# Patient Record
Sex: Female | Born: 1970 | Race: White | Hispanic: No | Marital: Married
Health system: Southern US, Community
[De-identification: ages and names within clinical notes are randomized; demographics above are authoritative.]

## PROBLEM LIST (undated history)

## (undated) HISTORY — PX: AUGMENTATION MAMMAPLASTY: SUR837

---

## 2010-03-06 ENCOUNTER — Ambulatory Visit (HOSPITAL_COMMUNITY): Admission: RE | Admit: 2010-03-06 | Discharge: 2010-03-06 | Payer: Self-pay | Admitting: Diagnostic Radiology

## 2010-03-08 ENCOUNTER — Encounter: Admission: RE | Admit: 2010-03-08 | Discharge: 2010-03-08 | Payer: Self-pay | Admitting: Plastic Surgery

## 2012-07-10 ENCOUNTER — Other Ambulatory Visit: Payer: Self-pay | Admitting: Obstetrics & Gynecology

## 2012-07-10 DIAGNOSIS — Z9882 Breast implant status: Secondary | ICD-10-CM

## 2012-07-10 DIAGNOSIS — Z1231 Encounter for screening mammogram for malignant neoplasm of breast: Secondary | ICD-10-CM

## 2012-08-13 ENCOUNTER — Ambulatory Visit
Admission: RE | Admit: 2012-08-13 | Discharge: 2012-08-13 | Disposition: A | Discharge status: Home / Self Care | Payer: BC Managed Care – PPO | Source: Ambulatory Visit | Attending: Obstetrics & Gynecology | Admitting: Obstetrics & Gynecology

## 2012-08-13 DIAGNOSIS — Z9882 Breast implant status: Secondary | ICD-10-CM

## 2012-08-13 DIAGNOSIS — Z1231 Encounter for screening mammogram for malignant neoplasm of breast: Secondary | ICD-10-CM

## 2013-11-25 ENCOUNTER — Other Ambulatory Visit: Payer: Self-pay

## 2013-11-25 DIAGNOSIS — Z1231 Encounter for screening mammogram for malignant neoplasm of breast: Secondary | ICD-10-CM

## 2013-12-11 ENCOUNTER — Encounter (INDEPENDENT_AMBULATORY_CARE_PROVIDER_SITE_OTHER): Payer: Self-pay

## 2013-12-11 ENCOUNTER — Ambulatory Visit
Admission: RE | Admit: 2013-12-11 | Discharge: 2013-12-11 | Disposition: A | Discharge status: Home / Self Care | Payer: BC Managed Care – PPO | Source: Ambulatory Visit

## 2013-12-11 DIAGNOSIS — Z1231 Encounter for screening mammogram for malignant neoplasm of breast: Secondary | ICD-10-CM

## 2014-11-23 ENCOUNTER — Other Ambulatory Visit: Payer: Self-pay

## 2014-11-23 DIAGNOSIS — Z1231 Encounter for screening mammogram for malignant neoplasm of breast: Secondary | ICD-10-CM

## 2014-12-23 ENCOUNTER — Ambulatory Visit
Admission: RE | Admit: 2014-12-23 | Discharge: 2014-12-23 | Disposition: A | Discharge status: Home / Self Care | Payer: BLUE CROSS/BLUE SHIELD | Source: Ambulatory Visit

## 2014-12-23 DIAGNOSIS — Z1231 Encounter for screening mammogram for malignant neoplasm of breast: Secondary | ICD-10-CM

## 2015-12-21 ENCOUNTER — Other Ambulatory Visit: Payer: Self-pay

## 2015-12-21 DIAGNOSIS — Z1231 Encounter for screening mammogram for malignant neoplasm of breast: Secondary | ICD-10-CM

## 2016-02-01 ENCOUNTER — Ambulatory Visit
Admission: RE | Admit: 2016-02-01 | Discharge: 2016-02-01 | Disposition: A | Discharge status: Home / Self Care | Payer: BLUE CROSS/BLUE SHIELD | Source: Ambulatory Visit

## 2016-02-01 DIAGNOSIS — Z1231 Encounter for screening mammogram for malignant neoplasm of breast: Secondary | ICD-10-CM

## 2016-09-25 ENCOUNTER — Ambulatory Visit (INDEPENDENT_AMBULATORY_CARE_PROVIDER_SITE_OTHER): Payer: 59 | Admitting: Rehabilitative and Restorative Service Providers"

## 2016-09-25 DIAGNOSIS — R2689 Other abnormalities of gait and mobility: Secondary | ICD-10-CM

## 2016-09-25 DIAGNOSIS — R531 Weakness: Secondary | ICD-10-CM

## 2016-09-25 DIAGNOSIS — M25561 Pain in right knee: Secondary | ICD-10-CM

## 2016-09-25 NOTE — Patient Instructions (Addendum)
Quad Sets    Slowly tighten thigh muscles of straight, left leg while counting out loud to _5-10__. Relax. Repeat __10__ times. 2-3 sets Do __2-3 __ sessions per day.   HIP: Flexion / KNEE: Extension, Straight Leg Raise    Tighten quad. Raise leg, keeping knee straight. Perform slowly. _5-10__ reps per set, _1-3__ sets per day, _2-3__ days per week    Strengthening: Hip Abduction (Side-Lying)    Tighten muscles on front of left thigh, then lift leg _10-12___ inches from surface, keeping knee locked.  Repeat __10__ times per set. Do _1-3___ sets per session. Do __2__ sessions per day.    Strengthening: Hip Adduction (Side-Lying)    Tighten muscles on front of right thigh, then lift leg __q few__ inches from surface, keeping knee locked.  Repeat ___10_ times per set. Do ___1-3_ sets per session. Do __2__ sessions per day.    Knee Extension (Sitting)   Sitting at edge of bed - use strap to slowly lower foot bending knee hold for 10-20 sec  5-18 reps 2-3 times/day    Ice ice ice and elevation above heart   TENS UNIT: This is helpful for muscle pain and spasm.   Search and Purchase a TENS 7000 2nd edition at www.tenspros.com. It should be less than $30.     TENS unit instructions: Do not shower or bathe with the unit on Turn the unit off before removing electrodes or batteries If the electrodes lose stickiness add a drop of water to the electrodes after they are disconnected from the unit and place on plastic sheet. If you continued to have difficulty, call the TENS unit company to purchase more electrodes. Do not apply lotion on the skin area prior to use. Make sure the skin is clean and dry as this will help prolong the life of the electrodes. After use, always check skin for unusual red areas, rash or other skin difficulties. If there are any skin problems, does not apply electrodes to the same area. Never remove the electrodes from the unit by pulling the  wires. Do not use the TENS unit or electrodes other than as directed. Do not change electrode placement without consultating your therapist or physician. Keep 2 fingers with between each electrode.

## 2016-09-25 NOTE — Therapy (Signed)
[facility] [address] [place], [place], [address] Phone: [phone]   Fax:  [phone]  Physical Therapy Evaluation  Patient Details  Name: Valerie Lambert MRN: [id number] Date of Birth: [birth year] Referring Provider: Dr Valma CavaAndrew Collins   Encounter Date: 09/25/2016      PT End of Session - 09/25/16 1525    Visit Number 1   Number of Visits 24   Date for PT Re-Evaluation 12/18/16   PT Start Time 1525   PT Stop Time 1619   PT Time Calculation (min) 54 min   Activity Tolerance Patient tolerated treatment well      No past medical history on file.  No past surgical history on file.  There were no vitals filed for this visit.       Subjective Assessment - 09/25/16 1534    Subjective Valerie SettleBrooke reports that she injured Rt knee in a skiing accident 08/12/16. She underwent ACL repair 09/21/16.   Pertinent History denies any medical or musculoskeletal problems    How long can you sit comfortably? no limit   How long can you stand comfortably? 5 min    How long can you walk comfortably? 5 min    Diagnostic tests MRI xrays    Patient Stated Goals get knee working again so she can return to normal activities    Currently in Pain? Yes   Pain Score 4    Pain Location Knee   Pain Orientation Right   Pain Descriptors / Indicators Sharp   Pain Type Surgical pain   Pain Onset In the past 7 days   Pain Frequency Constant   Aggravating Factors  standing; walking; getting up or down    Pain Relieving Factors meds; lying down and elevating LE             [facility] - 09/25/16 0001      Assessment   Medical Diagnosis Rt ACL repair    Referring Provider Dr Valma CavaAndrew Collins    Onset Date/Surgical Date 09/21/16   Hand Dominance Right   Next MD Visit 10/30/16   Prior Therapy none     Precautions   Precaution Comments ACL precautions    Required Braces or Orthoses Knee Immobilizer - Right  24/7 off for bathing     Knee Immobilizer - Right On at all times;Other (comment)  locked in full extension - off for exercise      Restrictions   Weight Bearing Restrictions No     Balance Screen   Has the patient fallen in the past 6 months Yes   How many times? 1   Has the patient had a decrease in activity level because of a fear of falling?  No   Is the patient reluctant to leave their home because of a fear of falling?  No     Home Environment   Additional Comments multilevel home - stays on first level - 2 steps to enter railing Rt ascending      Prior Function   Level of Independence Independent   Vocation Full time employment   HerbalistVocation Requirements sales; travel and computer work    Leisure household chores; care for kids      Observation/Other Assessments   Focus on Therapeutic Outcomes (FOTO)  70% limitation      Sensation   Additional Comments WFL's per pt report      Posture/Postural Control   Posture Comments head forward; shoudlers rounded; flexed forward  at hips; wt shifted to Lt LE in standing      AROM   Overall AROM Comments Lt LE WFL's    Right Knee Extension 0   Right Knee Flexion 45  sitting at edge of bed - Rt LE supported      Strength   Overall Strength Comments Lt LE WFL's Rt not tested      Flexibility   Hamstrings Rt tightness ~ 70 deg; Lt ~90 deg      Ambulation/Gait   Ambulation/Gait Yes   Ambulation/Gait Assistance 7: Independent;5: Supervision   Ambulation Distance (Feet) 70 Feet  x2   Assistive device Rolling walker   Gait Pattern Decreased weight shift to right;Right hip hike;Left steppage;Trunk flexed;Wide base of support   Ambulation Surface Level;Indoor   Gait velocity slowed    Gait Comments address gait pattern instructing pt in improved step length and more upright posture                    [facility] Adult PT Treatment/Exercise - 09/25/16 0001      Knee/Hip Exercises: Stretches   Passive Hamstring Stretch 3 reps;30 seconds  supine  with strap      Knee/Hip Exercises: Seated   Knee/Hip Flexion assisted knee flexion 5-10 sec hold      Knee/Hip Exercises: Supine   Quad Sets Strengthening;Right;1 set;10 reps  5 sec hold    Straight Leg Raises Strengthening;Right;1 set;5 reps  PT assist      Knee/Hip Exercises: Sidelying   Hip ABduction Strengthening;Right;1 set;10 reps   Hip ADduction Strengthening;Right;1 set;10 reps     Vasopneumatic   Number Minutes Vasopneumatic  15 minutes   Vasopnuematic Location  Knee  Rt   Vasopneumatic Pressure Low   Vasopneumatic Temperature  34 deg                 PT Education - 09/25/16 1605    Education provided Yes   Education Details HEP TENS    Person(s) Educated Patient   Methods Explanation;Demonstration;Tactile cues;Verbal cues;Handout   Comprehension Verbalized understanding;Returned demonstration;Verbal cues required;Tactile cues required          PT Short Term Goals - 09/25/16 1718      PT SHORT TERM GOAL #1   Title Increase AROM Rt knee 0-115 deg 11/06/16   Time 6   Period Weeks   Status New     PT SHORT TERM GOAL #2   Title Independent gait with least assistive device and good gait pattern 11/06/16   Time 6   Period Weeks   Status New     PT SHORT TERM GOAL #3   Title Independent i ninitial HEP 11/06/16   Time 6   Period Weeks           PT Long Term Goals - 09/25/16 1719      PT LONG TERM GOAL #1   Title Full ROM Rt knee 12/18/16    Time 12   Period Weeks   Status New     PT LONG TERM GOAL #2   Title 5/5 strength Rt LE 12/18/16   Time 12   Period Weeks   Status New     PT LONG TERM GOAL #3   Title Normal gait pattern without assistive device 12/18/16   Time 12   Period Weeks   Status New     PT LONG TERM GOAL #4   Title Independent in HEP 12/18/16   Time 12  Period Weeks   Status New     PT LONG TERM GOAL #5   Title Improve FOTO to </= 41% limitation 12/18/16   Time 12   Period Weeks   Status New                Plan - 09/25/16 1714    Clinical Impression Statement Valerie Lambert presents s/p Rt ACL repair 09/21/16. She has abnormal gait pattern; limited ROM(per post op protocol - limited 0 to 90 deg); decreased strength; decreased functional activity level; pain on a daily basis. Patient will benefit from PT to address problems identified.    Rehab Potential Good   PT Frequency 2x / week   PT Duration 12 weeks   PT Treatment/Interventions Patient/family education;ADLs/Self Care Home Management;Cryotherapy;Electrical Stimulation;Iontophoresis 4mg /ml Dexamethasone;Moist Heat;Ultrasound;Dry needling;Manual techniques;Vasopneumatic Device;Therapeutic activities;Therapeutic exercise   PT Next Visit Plan progress with ROM and strengthening per protocol; modalities as indicated; tiral of estim for pain management    Consulted and Agree with Plan of Care Patient      Patient will benefit from skilled therapeutic intervention in order to improve the following deficits and impairments:  Postural dysfunction, Improper body mechanics, Pain, Decreased range of motion, Decreased mobility, Decreased strength, Abnormal gait, Increased fascial restricitons, Increased muscle spasms, Decreased activity tolerance  Visit Diagnosis: Acute pain of right knee - Plan: PT plan of care cert/re-cert  Weakness generalized - Plan: PT plan of care cert/re-cert  Other abnormalities of gait and mobility - Plan: PT plan of care cert/re-cert     Problem List There are no active problems to display for this patient.   Aryon Nham Rober Minion PT, MPH  09/25/2016, 5:23 PM  [facility] [address] [place], [place], [address] Phone: [phone]   Fax:  [phone]  Name: Jia Dottavio MRN: [id number] Date of Birth: 07/04/1971

## 2016-09-28 ENCOUNTER — Ambulatory Visit (INDEPENDENT_AMBULATORY_CARE_PROVIDER_SITE_OTHER): Payer: 59 | Admitting: Physical Therapy

## 2016-09-28 DIAGNOSIS — R2689 Other abnormalities of gait and mobility: Secondary | ICD-10-CM

## 2016-09-28 DIAGNOSIS — M25561 Pain in right knee: Secondary | ICD-10-CM | POA: Diagnosis not present

## 2016-09-28 DIAGNOSIS — R531 Weakness: Secondary | ICD-10-CM | POA: Diagnosis not present

## 2016-09-28 NOTE — Therapy (Signed)
[facility] [place] [address] [place], [place], [address] Phone: [phone]   Fax:  [phone]  Physical Therapy Treatment  Patient Details  Name: Valerie Lambert MRN: [id number] Date of Birth: January 29, 1971 Referring Provider: Dr. Valma Cava   Encounter Date: 09/28/2016      PT End of Session - 09/28/16 1127    Visit Number 2   Number of Visits 24   Date for PT Re-Evaluation 12/18/16   PT Start Time 1015   PT Stop Time 1119   PT Time Calculation (min) 64 min   Activity Tolerance Patient tolerated treatment well   Behavior During Therapy [facility] for tasks assessed/performed      No past medical history on file.  No past surgical history on file.  There were no vitals filed for this visit.      Subjective Assessment - 09/28/16 1029    Subjective Pt reports she was confused about HEP, would like some clarification on how/when to perform. Has been icing and elevating.    Currently in Pain? No/denies   Pain Score 0-No pain  Pulling sensation with knee flexion   Pain Location Knee   Pain Orientation Right            [facility] PT Assessment - 09/28/16 0001      Assessment   Medical Diagnosis Rt ACL repair    Referring Provider Dr. Valma Cava    Onset Date/Surgical Date 09/21/16   Hand Dominance Right   Next MD Visit 10/30/16   Prior Therapy none     Precautions   Precaution Comments ACL precautions    Required Braces or Orthoses Knee Immobilizer - Right  24/7 off for bathing    Knee Immobilizer - Right On at all times;Other (comment)  locked in full extension - off for exercise      AROM   Right Knee Extension -4   Right Knee Flexion 80          [facility] Adult PT Treatment/Exercise - 09/28/16 0001      Knee/Hip Exercises: Standing   Other Standing Knee Exercises weight shifts side to side and front to back with UE support on walker.       Knee/Hip Exercises: Seated   Knee/Hip Flexion assisted  (use of other LE) knee flexion 5-10 sec hold, 5 reps     Other Seated Knee/Hip Exercises seated scoots x 10 sec, x 5 reps      Knee/Hip Exercises: Supine   Quad Sets Right;1 set;10 reps  5 sec hold   Quad Sets Limitations early fatigue; difficulty initiating   Heel Slides Right;1 set;10 reps  to 60 deg.    Straight Leg Raises Strengthening;Right;2 sets;5 reps  quad lag noted   Patellar Mobs pt educated on how to perform per protocol.    Other Supine Knee/Hip Exercises hamstring isometrics 10 sec x 5 reps @ 30 deg, 60 deg.    Other Supine Knee/Hip Exercises Educated pt on protocol and precautions. Issued copy of rehab protocol from surgeon.      Knee/Hip Exercises: Sidelying   Hip ABduction Strengthening;Right;1 set;10 reps   Hip ABduction Limitations (challenging)   Hip ADduction Strengthening;Right;1 set;5 reps     Modalities   Modalities Electrical Stimulation;Vasopneumatic     Electrical Stimulation   Electrical Stimulation Location Rt knee   Electrical Stimulation Action ion repelling    Electrical Stimulation Parameters to tolerance   Electrical Stimulation Goals Edema;Pain     Vasopneumatic  Number Minutes Vasopneumatic  15 minutes   Vasopnuematic Location  Knee  Rt   Vasopneumatic Pressure Low   Vasopneumatic Temperature  34 deg                 PT Education - 09/28/16 1103    Education provided Yes   Education Details HEP, added sitting scoots.    Person(s) Educated Patient   Methods Explanation;Handout;Demonstration   Comprehension Verbalized understanding;Returned demonstration          PT Short Term Goals - 09/28/16 1131      PT SHORT TERM GOAL #1   Title Increase AROM Rt knee 0-115 deg 11/06/16   Time 6   Period Weeks   Status On-going     PT SHORT TERM GOAL #2   Title Independent gait with least assistive device and good gait pattern 11/06/16   Time 6   Period Weeks   Status On-going     PT SHORT TERM GOAL #3   Title Independent in  initial HEP 11/06/16   Time 6   Period Weeks   Status On-going           PT Long Term Goals - 09/28/16 1129      PT LONG TERM GOAL #1   Title Full ROM Rt knee 12/18/16    Time 12   Period Weeks   Status On-going     PT LONG TERM GOAL #2   Title 5/5 strength Rt LE 12/18/16   Time 12   Period Weeks   Status On-going     PT LONG TERM GOAL #3   Title Normal gait pattern without assistive device 12/18/16   Time 12   Period Weeks   Status On-going     PT LONG TERM GOAL #4   Title Independent in HEP 12/18/16   Time 12   Period Weeks   Status On-going     PT LONG TERM GOAL #5   Title Improve FOTO to </= 41% limitation 12/18/16   Time 12   Period Weeks   Status On-going               Plan - 09/28/16 1127    Clinical Impression Statement Pt demonstrated improved Rt knee flexion during the course of treatment today (64 deg up to 80 deg). Pt tolerated all new exercises well, with minimal to no pain.  Pt issued copy of her protocol as well as new exercises within protocol.    Rehab Potential Good   PT Frequency 2x / week   PT Duration 12 weeks   PT Treatment/Interventions Patient/family education;ADLs/Self Care Home Management;Cryotherapy;Electrical Stimulation;Iontophoresis 4mg /ml Dexamethasone;Moist Heat;Ultrasound;Dry needling;Manual techniques;Vasopneumatic Device;Therapeutic activities;Therapeutic exercise   PT Next Visit Plan progress with ROM and strengthening per protocol; modalities as indicated   Consulted and Agree with Plan of Care Patient      Patient will benefit from skilled therapeutic intervention in order to improve the following deficits and impairments:  Postural dysfunction, Improper body mechanics, Pain, Decreased range of motion, Decreased mobility, Decreased strength, Abnormal gait, Increased fascial restricitons, Increased muscle spasms, Decreased activity tolerance  Visit Diagnosis: Acute pain of right knee  Weakness generalized  Other  abnormalities of gait and mobility     Problem List There are no active problems to display for this patient.  Mayer Camel, PTA 09/28/16 11:36 AM  [facility] [address] [place], [place], [address] Phone: [phone]   Fax:  [phone]  Name:  Valerie Lambert MRN: [id number] Date of Birth: 1971-01-26

## 2016-09-28 NOTE — Patient Instructions (Addendum)
Sitting Scoot    Scoot to the front of chair, then scoot back. Hold 10 seconds  Repeat __10__ times. Do _3___ sessions per day.  * can also use opposite leg to pull Right lower leg back.   Flexion: Isometric (Supine)    Position Patient: Bend left knee to about 30, 60 degrees, 90 deg. Dig heel in, hold 10 sec x 5-10 reps.   [facility] [address] [place], [place] [address]  [phone] (office) [phone] (fax)

## 2016-10-02 ENCOUNTER — Encounter: Payer: Self-pay | Admitting: Physical Therapy

## 2016-10-03 ENCOUNTER — Encounter: Payer: 59 | Admitting: Rehabilitative and Restorative Service Providers"

## 2016-10-05 ENCOUNTER — Ambulatory Visit (INDEPENDENT_AMBULATORY_CARE_PROVIDER_SITE_OTHER): Payer: 59 | Admitting: Physical Therapy

## 2016-10-05 DIAGNOSIS — R2689 Other abnormalities of gait and mobility: Secondary | ICD-10-CM | POA: Diagnosis not present

## 2016-10-05 DIAGNOSIS — M25561 Pain in right knee: Secondary | ICD-10-CM | POA: Diagnosis not present

## 2016-10-05 DIAGNOSIS — R531 Weakness: Secondary | ICD-10-CM

## 2016-10-05 NOTE — Therapy (Signed)
[facility] [place] [address] [place], [place], [address] Phone: [phone]   Fax:  [phone]  Physical Therapy Treatment  Patient Details  Name: Valerie Lambert MRN: [id number] Date of Birth: Mar 18, 1971 Referring Provider: Dr. Valma Cava  Encounter Date: 10/05/2016      PT End of Session - 10/05/16 1116    Visit Number 3   Number of Visits 24   Date for PT Re-Evaluation 12/18/16   PT Start Time 1105   PT Stop Time 1202   PT Time Calculation (min) 57 min      No past medical history on file.  No past surgical history on file.  There were no vitals filed for this visit.      Subjective Assessment - 10/05/16 1234    Subjective Pt ambulates into therapy with SPC in Rt hand; reports she has been using it for a day.  She reports her Rt calf has been very sore lately so she has been massaging it.  She has been working hard on SLR and elevating legs.  She has questions regarding when she'll be able to drive and how to adjust her brace (keeps slipping down).  She has taken the bandages off Rt knee, steri strips are in place over incisions.  She is still wearing stocking and brace 24/7.     Currently in Pain? No/denies   Pain Score 0-No pain            [facility] PT Assessment - 10/05/16 0001      Assessment   Medical Diagnosis Rt ACL repair    Referring Provider Dr. Valma Cava   Onset Date/Surgical Date 09/21/16   Hand Dominance Right   Next MD Visit 10/30/16   Prior Therapy none     Precautions   Precaution Comments ACL precautions    Required Braces or Orthoses Knee Immobilizer - Right  24/7 off for bathing    Knee Immobilizer - Right On at all times;Other (comment)  locked in full extension - off for exercise      AROM   Right Knee Extension -8   Right Knee Flexion 90     Flexibility   Soft Tissue Assessment /Muscle Length yes   Quadriceps Rt 65 deg            [facility] Adult PT  Treatment/Exercise - 10/05/16 0001      Knee/Hip Exercises: Stretches   Lobbyist Right;3 reps;30 seconds  supine with strap     Knee/Hip Exercises: Aerobic   Recumbent Bike semi-circles for ROM x 5 min     Knee/Hip Exercises: Standing   Forward Step Up Right;1 set;10 reps;Hand Hold: 2  3" step   Stairs Pt educated on step to pattern (up with LLE, down with RLE) with SPC when brace on at home.  Pt returned demo on 2 -6" steps x 2 reps    Gait Training Pt educated on proper use of SPC with gait (placement, distance), step to pattern with VC for heel strike and to decrease Rt step length.  80 ft total; improved pattern with increased distance.     Other Standing Knee Exercises Rt TKE with tactile cues x 3 sec x 5 reps.    Other Standing Knee Exercises Rt hamstring curls, to tolerance x 8 reps.      Knee/Hip Exercises: Seated   Other Seated Knee/Hip Exercises seated scoots x 10 sec, x 10 reps      Knee/Hip Exercises:  Supine   Quad Sets Right;1 set;10 reps  5 sec hold   Quad Sets Limitations early fatigue, difficulty intiating.    Straight Leg Raises Right;2 sets;10 reps   Straight Leg Raises Limitations quad lag noted.    Other Supine Knee/Hip Exercises long sitting quad set with small leg lift x 5 reps      Knee/Hip Exercises: Sidelying   Hip ABduction Strengthening;Right;1 set;10 reps   Hip ABduction Limitations tactile cues to improve body position.      Modalities   Modalities Science writer Parameters to tolerance    Electrical Stimulation Goals Edema;Pain     Vasopneumatic   Number Minutes Vasopneumatic  15 minutes   Vasopnuematic Location  Knee  Rt   Vasopneumatic Pressure Low   Vasopneumatic Temperature  34 deg                 PT Education - 10/05/16 1236    Education provided Yes    Education Details stair training, gait with SPC (step to pattern).  reviewed protocol for 2wks ACL post op.    Person(s) Educated Patient   Methods Explanation   Comprehension Verbalized understanding          PT Short Term Goals - 10/05/16 1252      PT SHORT TERM GOAL #1   Title Increase AROM Rt knee 0-115 deg 11/06/16   Time 6   Period Weeks   Status On-going     PT SHORT TERM GOAL #2   Title Independent gait with least assistive device and good gait pattern 11/06/16   Time 6   Period Weeks   Status On-going     PT SHORT TERM GOAL #3   Title Independent in initial HEP 11/06/16   Time 6   Period Weeks   Status On-going           PT Long Term Goals - 10/05/16 1251      PT LONG TERM GOAL #1   Title Full ROM Rt knee 12/18/16    Time 12   Period Weeks   Status On-going     PT LONG TERM GOAL #2   Title 5/5 strength Rt LE 12/18/16   Time 12   Period Weeks   Status On-going     PT LONG TERM GOAL #3   Title Normal gait pattern without assistive device 12/18/16   Time 12   Period Weeks   Status On-going     PT LONG TERM GOAL #4   Title Independent in HEP 12/18/16   Time 12   Period Weeks   Status On-going     PT LONG TERM GOAL #5   Title Improve FOTO to </= 41% limitation 12/18/16   Time 12   Period Weeks   Status On-going               Plan - 10/05/16 1252    Clinical Impression Statement [name] is in the last day of week 2 post-op ACL reconstruction.  She switched to [facility] yesterday and is observed using cane in Rt hand and walking with Rt foot plantar flexed.  With demo and VC, short gait trials improved quality; improved Rt heel strike in step0-to pattern.  Her Rt knee ext ROM has decreased, flexion has improved by 10 deg.  She continues to demonstrate poor Rt quad  contraction and extensor lag with SLR. She is making gradual gains towards established goals.    Rehab Potential Good   PT Frequency 2x / week   PT Duration 12 weeks   PT  Treatment/Interventions Patient/family education;ADLs/Self Care Home Management;Cryotherapy;Electrical Stimulation;Iontophoresis 4mg /ml Dexamethasone;Moist Heat;Ultrasound;Dry needling;Manual techniques;Vasopneumatic Device;Therapeutic activities;Therapeutic exercise   PT Next Visit Plan progress with ROM and strengthening per protocol; modalities as indicated   Consulted and Agree with Plan of Care Patient      Patient will benefit from skilled therapeutic intervention in order to improve the following deficits and impairments:  Postural dysfunction, Improper body mechanics, Pain, Decreased range of motion, Decreased mobility, Decreased strength, Abnormal gait, Increased fascial restricitons, Increased muscle spasms, Decreased activity tolerance  Visit Diagnosis: Acute pain of right knee  Weakness generalized  Other abnormalities of gait and mobility     Problem List There are no active problems to display for this patient.  Mayer CamelJennifer Carlson-Long, PTA 10/05/16 1:08 PM  [facility] [address] [place], [place], [address] Phone: [phone]   Fax:  [phone]  Name: Carlyon Prowsshley [name] Wiedel MRN: [id number] Date of Birth: 12-30-1970

## 2016-10-09 ENCOUNTER — Ambulatory Visit (INDEPENDENT_AMBULATORY_CARE_PROVIDER_SITE_OTHER): Payer: 59 | Admitting: Physical Therapy

## 2016-10-09 DIAGNOSIS — R2689 Other abnormalities of gait and mobility: Secondary | ICD-10-CM

## 2016-10-09 DIAGNOSIS — M25561 Pain in right knee: Secondary | ICD-10-CM

## 2016-10-09 DIAGNOSIS — R531 Weakness: Secondary | ICD-10-CM | POA: Diagnosis not present

## 2016-10-09 NOTE — Therapy (Signed)
[facility] [place] [address] [place], [place], [address] Phone: [phone]   Fax:  [phone]  Physical Therapy Treatment  Patient Details  Name: Valerie Lambert MRN: [id number] Date of Birth: 1971-01-12 Referring Provider: Dr. Valma Cava  Encounter Date: 10/09/2016      PT End of Session - 10/09/16 1104    Visit Number 4   Number of Visits 24   Date for PT Re-Evaluation 12/18/16   PT Start Time 1015   PT Stop Time 1118   PT Time Calculation (min) 63 min   Activity Tolerance Patient tolerated treatment well   Behavior During Therapy [facility] for tasks assessed/performed      No past medical history on file.  No past surgical history on file.  There were no vitals filed for this visit.      Subjective Assessment - 10/09/16 1019    Subjective feels like she's having a hard time with a good quad contraction.  calf is feeling much better now.   Pertinent History denies any medical or musculoskeletal problems    Patient Stated Goals get knee working again so she can return to normal activities    Currently in Pain? No/denies   Pain Score --  expected discomfort with bending                         [facility] Adult PT Treatment/Exercise - 10/09/16 1020      Knee/Hip Exercises: Aerobic   Recumbent Bike partial revolutions x 8 min     Knee/Hip Exercises: Supine   Quad Sets Right;2 sets;10 reps   Quad Sets Limitations 1st set with e-stim; 2nd set with small towel roll under distal thigh with improved quad activation   Short Arc [employer] Right;10 reps   Short Arc Quad Sets Limitations small towel with cues for quad control   Heel Slides Right;AAROM;10 reps   Heel Slides Limitations with strap   Straight Leg Raises Right;10 reps   Straight Leg Raises Limitations with estim and cues to decrease quad lag     Modalities   Modalities Corporate treasurer Stimulation Location Rt quad / R knee   Electrical Stimulation Action Symmetric Biphasic 10 sec on/20 sec off for improved quad activation up to 48 mA; ion repelling   Electrical Stimulation Goals Neuromuscular facilitation;Strength;Edema;Pain     Vasopneumatic   Number Minutes Vasopneumatic  15 minutes   Vasopnuematic Location  Knee  Rt   Vasopneumatic Pressure Low   Vasopneumatic Temperature  34 deg                 PT Education - 10/09/16 1104    Education provided Yes   Education Details add towel to quad set    Person(s) Educated Patient   Methods Explanation;Demonstration   Comprehension Verbalized understanding;Returned demonstration          PT Short Term Goals - 10/05/16 1252      PT SHORT TERM GOAL #1   Title Increase AROM Rt knee 0-115 deg 11/06/16   Time 6   Period Weeks   Status On-going     PT SHORT TERM GOAL #2   Title Independent gait with least assistive device and good gait pattern 11/06/16   Time 6   Period Weeks   Status On-going     PT SHORT TERM GOAL #3   Title Independent in initial HEP  11/06/16   Time 6   Period Weeks   Status On-going           PT Long Term Goals - 10/05/16 1251      PT LONG TERM GOAL #1   Title Full ROM Rt knee 12/18/16    Time 12   Period Weeks   Status On-going     PT LONG TERM GOAL #2   Title 5/5 strength Rt LE 12/18/16   Time 12   Period Weeks   Status On-going     PT LONG TERM GOAL #3   Title Normal gait pattern without assistive device 12/18/16   Time 12   Period Weeks   Status On-going     PT LONG TERM GOAL #4   Title Independent in HEP 12/18/16   Time 12   Period Weeks   Status On-going     PT LONG TERM GOAL #5   Title Improve FOTO to </= 41% limitation 12/18/16   Time 12   Period Weeks   Status On-going               Plan - 10/09/16 1253    Clinical Impression Statement Session today focused on increasing knee flexion and improving quad control of RLE.  Utilized  estim for quad activation and pt able to perform improved quad set after estim.  Progressing well towards goals.   PT Treatment/Interventions Patient/family education;ADLs/Self Care Home Management;Cryotherapy;Electrical Stimulation;Iontophoresis 4mg /ml Dexamethasone;Moist Heat;Ultrasound;Dry needling;Manual techniques;Vasopneumatic Device;Therapeutic activities;Therapeutic exercise   PT Next Visit Plan progress with ROM and strengthening per protocol; modalities as indicated   Consulted and Agree with Plan of Care Patient      Patient will benefit from skilled therapeutic intervention in order to improve the following deficits and impairments:  Postural dysfunction, Improper body mechanics, Pain, Decreased range of motion, Decreased mobility, Decreased strength, Abnormal gait, Increased fascial restricitons, Increased muscle spasms, Decreased activity tolerance  Visit Diagnosis: Acute pain of right knee  Weakness generalized  Other abnormalities of gait and mobility     Problem List There are no active problems to display for this patient.     Clarita CraneStephanie F Osman Calzadilla, PT, DPT 10/09/16 12:59 PM    [facility] [address] [place], [place], [address] Phone: [phone]   Fax:  [phone]  Name: Valerie Lambert MRN: [id number] Date of Birth: [birth year]

## 2016-10-12 ENCOUNTER — Ambulatory Visit (INDEPENDENT_AMBULATORY_CARE_PROVIDER_SITE_OTHER): Payer: 59 | Admitting: Physical Therapy

## 2016-10-12 DIAGNOSIS — M25561 Pain in right knee: Secondary | ICD-10-CM | POA: Diagnosis not present

## 2016-10-12 DIAGNOSIS — R2689 Other abnormalities of gait and mobility: Secondary | ICD-10-CM

## 2016-10-12 DIAGNOSIS — R531 Weakness: Secondary | ICD-10-CM | POA: Diagnosis not present

## 2016-10-12 NOTE — Therapy (Addendum)
[facility] [place] [address] [place], [place], [address] Phone: [phone]   Fax:  [phone]  Physical Therapy Treatment  Patient Details  Name: Valerie Lambert MRN: [id number] Date of Birth: 07/26/1970 Referring Provider: Dr. Valma Cava  Encounter Date: 10/12/2016      PT End of Session - 10/12/16 1032    Visit Number 5   Number of Visits 24   Date for PT Re-Evaluation 12/18/16   PT Start Time 1017   PT Stop Time 1115   PT Time Calculation (min) 58 min      No past medical history on file.  No past surgical history on file.  There were no vitals filed for this visit.      Subjective Assessment - 10/12/16 1312    Subjective Pt reports she has been working hard at quad contraction and leg lifts. She is still avoiding stairs.  Steri strips came off in shower today.    Currently in Pain? No/denies   Pain Score 0-No pain            [facility] PT Assessment - 10/12/16 0001      Assessment   Medical Diagnosis Rt ACL repair    Referring Provider Dr. Valma Cava   Onset Date/Surgical Date 09/21/16   Hand Dominance Right   Next MD Visit 10/30/16     Precautions   Precaution Comments ACL precautions    Required Braces or Orthoses Knee Immobilizer - Right  24/7 off for bathing    Knee Immobilizer - Right On at all times;Other (comment)  locked in full extension - off for exercise      AROM   Right Knee Extension -7  -5 after stretches   Right Knee Flexion 95     Flexibility   Quadriceps Rt knee 75 deg                     [facility] Adult PT Treatment/Exercise - 10/12/16 0001      Knee/Hip Exercises: Stretches   Passive Hamstring Stretch 3 reps;30 seconds  supine with strap    Quad Stretch Right;3 reps;30 seconds  supine with strap     Knee/Hip Exercises: Aerobic   Recumbent Bike partial revolutions x 6.5 min     Knee/Hip Exercises: Seated   Other Seated Knee/Hip Exercises seated  scoots x 10 sec, x 10 reps      Knee/Hip Exercises: Supine   Quad Sets Right;1 set;5 reps   Quad Sets Limitations continues to substitute glute/hamstring   Heel Slides Right;AAROM;5 reps   Heel Slides Limitations with strap   Straight Leg Raises Right;10 reps     Knee/Hip Exercises: Prone   Hamstring Curl 1 set;10 reps  between prone hang trials.    Prone Knee Hang 1 minute  RLE, 2 trials.      Modalities   Modalities Counsellor Parameters to tolerance    Electrical Stimulation Goals Edema     Vasopneumatic   Number Minutes Vasopneumatic  15 minutes   Vasopnuematic Location  Knee  Rt   Vasopneumatic Pressure Low   Vasopneumatic Temperature  34 deg      Manual Therapy   Manual Therapy Passive ROM;Taping   Passive ROM Overpressure into Rt knee ext with heel propped, multiple reps.    Kinesiotex Edema  Kinesiotix   Edema web pattern of Rock tape applied to ant Rt knee (avoiding incisions) to reduce edema.                 PT Education - 10/12/16 1107    Education provided Yes   Education Details HEP    Person(s) Educated Patient   Methods Explanation   Comprehension Verbalized understanding          PT Short Term Goals - 10/05/16 1252      PT SHORT TERM GOAL #1   Title Increase AROM Rt knee 0-115 deg 11/06/16   Time 6   Period Weeks   Status On-going     PT SHORT TERM GOAL #2   Title Independent gait with least assistive device and good gait pattern 11/06/16   Time 6   Period Weeks   Status On-going     PT SHORT TERM GOAL #3   Title Independent in initial HEP 11/06/16   Time 6   Period Weeks   Status On-going           PT Long Term Goals - 10/05/16 1251      PT LONG TERM GOAL #1   Title Full ROM Rt knee 12/18/16    Time 12   Period Weeks   Status On-going     PT LONG  TERM GOAL #2   Title 5/5 strength Rt LE 12/18/16   Time 12   Period Weeks   Status On-going     PT LONG TERM GOAL #3   Title Normal gait pattern without assistive device 12/18/16   Time 12   Period Weeks   Status On-going     PT LONG TERM GOAL #4   Title Independent in HEP 12/18/16   Time 12   Period Weeks   Status On-going     PT LONG TERM GOAL #5   Title Improve FOTO to </= 41% limitation 12/18/16   Time 12   Period Weeks   Status On-going               Plan - 10/12/16 1113    Clinical Impression Statement Pt demonstrated improvement in Rt knee flexion ROM; continues to lack full knee ext ROM.  Pt continues with weak quad contraction in RLE.  May benefit from additional sessions with estim for muscle re-ed. Pt making gradual gains towards established goals.    Rehab Potential Good   PT Frequency 2x / week   PT Duration 12 weeks   PT Treatment/Interventions Patient/family education;ADLs/Self Care Home Management;Cryotherapy;Electrical Stimulation;Iontophoresis 4mg /ml Dexamethasone;Moist Heat;Ultrasound;Dry needling;Manual techniques;Vasopneumatic Device;Therapeutic activities;Therapeutic exercise   PT Next Visit Plan estim for muscle re-ed, begin stairs and standing proprioception.    Consulted and Agree with Plan of Care Patient      Patient will benefit from skilled therapeutic intervention in order to improve the following deficits and impairments:  Postural dysfunction, Improper body mechanics, Pain, Decreased range of motion, Decreased mobility, Decreased strength, Abnormal gait, Increased fascial restricitons, Increased muscle spasms, Decreased activity tolerance  Visit Diagnosis: Acute pain of right knee  Weakness generalized  Other abnormalities of gait and mobility     Problem List There are no active problems to display for this patient.  Mayer CamelJennifer Carlson-Long, PTA 10/12/16 1:13 PM  [facility] [place] [address] [place], [place], [address] Phone: [phone]   Fax:  [phone]  Name: Valerie Lambert MRN: [id number] Date of Birth: 05-02-1971

## 2016-10-12 NOTE — Patient Instructions (Signed)
*   hamstring stretch  *gentle overpressure into extension (straight knee)  * continue all other exercises issued.   Knee Extension Mobilization: Hang (Prone)    With table supporting thighs, place __NO-0__ pound weight on right ankle. Hold __1-2__ minutes. Repeat __2__ times

## 2016-10-16 ENCOUNTER — Ambulatory Visit (INDEPENDENT_AMBULATORY_CARE_PROVIDER_SITE_OTHER): Payer: 59 | Admitting: Physical Therapy

## 2016-10-16 DIAGNOSIS — R2689 Other abnormalities of gait and mobility: Secondary | ICD-10-CM | POA: Diagnosis not present

## 2016-10-16 DIAGNOSIS — M25561 Pain in right knee: Secondary | ICD-10-CM | POA: Diagnosis not present

## 2016-10-16 DIAGNOSIS — R531 Weakness: Secondary | ICD-10-CM

## 2016-10-16 NOTE — Therapy (Signed)
[facility] [place] [address] [place], [place], [address] Phone: [phone]   Fax:  [phone]  Physical Therapy Treatment  Patient Details  Name: Valerie Lambert MRN: [id number] Date of Birth: 1971-05-06 Referring Provider: Dr. Valma Cava  Encounter Date: 10/16/2016      PT End of Session - 10/16/16 1117    Visit Number 6   Number of Visits 24   Date for PT Re-Evaluation 12/18/16   PT Start Time 0930   PT Stop Time 1030   PT Time Calculation (min) 60 min   Activity Tolerance Patient tolerated treatment well   Behavior During Therapy [facility] for tasks assessed/performed      No past medical history on file.  No past surgical history on file.  There were no vitals filed for this visit.      Subjective Assessment - 10/16/16 0932    Subjective still having a hard time sleeping, but otherwise doing well.  pain is minimal   Pertinent History denies any medical or musculoskeletal problems    Patient Stated Goals get knee working again so she can return to normal activities    Currently in Pain? No/denies                         [facility] Adult PT Treatment/Exercise - 10/16/16 0934      Knee/Hip Exercises: Aerobic   Recumbent Bike partial revolutions x 8 min     Knee/Hip Exercises: Supine   Quad Sets Right;2 sets;10 reps   Quad Sets Limitations after SAQs- mod to max cues for quad activation   Short Arc [employer] Right;2 sets;10 reps   Short Arc Quad Sets Limitations black bolster, then decreasing to pillow-for increased quad control   Straight Leg Raises Limitations x 5 min with Estim     Modalities   Modalities Proofreader Location Rt quad / R knee   Electrical Stimulation Action symmetric biphasic x 10 min (5sec on/5 sec off) with quad sets x 5 min; and SLR x 5 min; then IFC   Electrical Stimulation Parameters up  to 65 mA intensity with SBP, to tolerance with IFC   Electrical Stimulation Goals Edema;Strength;Neuromuscular facilitation;Pain     Vasopneumatic   Number Minutes Vasopneumatic  15 minutes   Vasopnuematic Location  Knee  Rt   Vasopneumatic Pressure Low   Vasopneumatic Temperature  34 deg                   PT Short Term Goals - 10/05/16 1252      PT SHORT TERM GOAL #1   Title Increase AROM Rt knee 0-115 deg 11/06/16   Time 6   Period Weeks   Status On-going     PT SHORT TERM GOAL #2   Title Independent gait with least assistive device and good gait pattern 11/06/16   Time 6   Period Weeks   Status On-going     PT SHORT TERM GOAL #3   Title Independent in initial HEP 11/06/16   Time 6   Period Weeks   Status On-going           PT Long Term Goals - 10/05/16 1251      PT LONG TERM GOAL #1   Title Full ROM Rt knee 12/18/16    Time 12   Period Weeks   Status On-going  PT LONG TERM GOAL #2   Title 5/5 strength Rt LE 12/18/16   Time 12   Period Weeks   Status On-going     PT LONG TERM GOAL #3   Title Normal gait pattern without assistive device 12/18/16   Time 12   Period Weeks   Status On-going     PT LONG TERM GOAL #4   Title Independent in HEP 12/18/16   Time 12   Period Weeks   Status On-going     PT LONG TERM GOAL #5   Title Improve FOTO to </= 41% limitation 12/18/16   Time 12   Period Weeks   Status On-going               Plan - 10/16/16 1118    Clinical Impression Statement Session today with continued focus on good quad contraction and use of estim for feedback.  Pt continues to have difficulty with quad set and therefore extensor lag still present with SLR.  Extension improved today and pt now with post knee resting on mat in long sitting and supine (not formally measured today).  Will continue to benefit from PT to maximize function.   PT Treatment/Interventions Patient/family education;ADLs/Self Care Home  Management;Cryotherapy;Electrical Stimulation;Iontophoresis 4mg /ml Dexamethasone;Moist Heat;Ultrasound;Dry needling;Manual techniques;Vasopneumatic Device;Therapeutic activities;Therapeutic exercise   PT Next Visit Plan estim for muscle re-ed, begin stairs and standing proprioception.    Consulted and Agree with Plan of Care Patient      Patient will benefit from skilled therapeutic intervention in order to improve the following deficits and impairments:  Postural dysfunction, Improper body mechanics, Pain, Decreased range of motion, Decreased mobility, Decreased strength, Abnormal gait, Increased fascial restricitons, Increased muscle spasms, Decreased activity tolerance  Visit Diagnosis: Acute pain of right knee  Weakness generalized  Other abnormalities of gait and mobility     Problem List There are no active problems to display for this patient.     Clarita CraneStephanie F Deeana [place]water, PT, DPT 10/16/16 11:21 AM    [facility] [address] [place], [place], [address] Phone: [phone]   Fax:  [phone]  Name: Valerie Lambert MRN: [id number] Date of Birth: [birth year]

## 2016-10-18 ENCOUNTER — Encounter: Payer: 59 | Admitting: Physical Therapy

## 2016-10-19 ENCOUNTER — Ambulatory Visit (INDEPENDENT_AMBULATORY_CARE_PROVIDER_SITE_OTHER): Payer: 59 | Admitting: Physical Therapy

## 2016-10-19 DIAGNOSIS — R2689 Other abnormalities of gait and mobility: Secondary | ICD-10-CM

## 2016-10-19 DIAGNOSIS — M25561 Pain in right knee: Secondary | ICD-10-CM | POA: Diagnosis not present

## 2016-10-19 DIAGNOSIS — R531 Weakness: Secondary | ICD-10-CM | POA: Diagnosis not present

## 2016-10-19 NOTE — Therapy (Signed)
[facility] [place] [address] [place], [place], [address] Phone: [phone]   Fax:  [phone]  Physical Therapy Treatment  Patient Details  Name: Valerie Lambert MRN: [id number] Date of Birth: 10/16/1970 Referring Provider: Dr. Valma Cava  Encounter Date: 10/19/2016      PT End of Session - 10/19/16 1105    Visit Number 7   Number of Visits 24   Date for PT Re-Evaluation 12/18/16   PT Start Time 1100   PT Stop Time 1205   PT Time Calculation (min) 65 min   Activity Tolerance Patient tolerated treatment well;No increased pain   Behavior During Therapy [facility] for tasks assessed/performed      No past medical history on file.  No past surgical history on file.  There were no vitals filed for this visit.      Subjective Assessment - 10/19/16 1105    Subjective Pt verbalized frustration with the slow progress, and difficulty with getting a good quad contraction. She continues to have difficulty sleeping due to not being able to get comfortable.    Patient Stated Goals get knee working again so she can return to normal activities    Currently in Pain? No/denies   Pain Score 0-No pain            [facility] PT Assessment - 10/19/16 0001      Assessment   Medical Diagnosis Rt ACL repair    Referring Provider Dr. Valma Cava   Onset Date/Surgical Date 09/21/16   Hand Dominance Right   Next MD Visit 10/30/16     Precautions   Precaution Comments ACL precautions      AROM   Right Knee Extension -4  supine   Right Knee Flexion 102  seated            [facility] Adult PT Treatment/Exercise - 10/19/16 0001      Knee/Hip Exercises: Stretches   Gastroc Stretch Right;2 reps;30 seconds     Knee/Hip Exercises: Standing   Heel Raises Both;15 reps   Lateral Step Up Right;1 set;10 reps;Hand Hold: 2;Step Height: 6"   Forward Step Up Right;20 reps;Hand Hold: 2;Step Height: 6"   Step Down Right;1 set;10 reps;Hand  Hold: 2;Step Height: 2"   SLS RLE multiple reps up to 12 seconds.    Other Standing Knee Exercises Rt TKE with tactile cues x 3 sec x 5 reps; repeated with green band behind knee, 5 sec hold x 10 reps.    Other Standing Knee Exercises Standing weight shifts with tactile cues to engage Rt quad.   GAIT:  160 ft (slow pace) without AD, without brace, close SBA.  VC given for Rt heel strike, increased toe off, increased Rt knee flexion during swing through, tactile cues for reciprocal arm swing.  Improved quality with increased distance - however continues to be antalgic with unequal stance time, step length.      Knee/Hip Exercises: Seated   Other Seated Knee/Hip Exercises seated scoots x 10 sec, x 10 reps      Knee/Hip Exercises: Supine   Heel Slides AAROM;Right;1 set;5 reps   Straight Leg Raises Right;1 set;5 reps   Straight Leg Raises Limitations 2nd set with 5 sec hold at 10" off matt.  Improved quad contraction, less lag.      Modalities   Modalities Vasopneumatic     Vasopneumatic   Number Minutes Vasopneumatic  12 minutes   Vasopnuematic Location  Knee  Rt   Vasopneumatic  Pressure Low   Vasopneumatic Temperature  34 deg                 PT Education - 10/19/16 1314    Education provided Yes   Education Details HEP   Person(s) Educated Patient   Methods Explanation;Handout;Demonstration;Tactile cues;Verbal cues   Comprehension Verbalized understanding;Returned demonstration          PT Short Term Goals - 10/05/16 1252      PT SHORT TERM GOAL #1   Title Increase AROM Rt knee 0-115 deg 11/06/16   Time 6   Period Weeks   Status On-going     PT SHORT TERM GOAL #2   Title Independent gait with least assistive device and good gait pattern 11/06/16   Time 6   Period Weeks   Status On-going     PT SHORT TERM GOAL #3   Title Independent in initial HEP 11/06/16   Time 6   Period Weeks   Status On-going           PT Long Term Goals - 10/05/16 1251      PT  LONG TERM GOAL #1   Title Full ROM Rt knee 12/18/16    Time 12   Period Weeks   Status On-going     PT LONG TERM GOAL #2   Title 5/5 strength Rt LE 12/18/16   Time 12   Period Weeks   Status On-going     PT LONG TERM GOAL #3   Title Normal gait pattern without assistive device 12/18/16   Time 12   Period Weeks   Status On-going     PT LONG TERM GOAL #4   Title Independent in HEP 12/18/16   Time 12   Period Weeks   Status On-going     PT LONG TERM GOAL #5   Title Improve FOTO to </= 41% limitation 12/18/16   Time 12   Period Weeks   Status On-going               Plan - 10/19/16 1312    Clinical Impression Statement Pt demonstrated improved Rt knee ROM (-4 ext, 102 flex).  Quad contraction of Rt knee improving; continues with min extensor lag with SLR.  Added addtional exercises to HEP.  Making gradual progress towards goals.    Rehab Potential Good   PT Frequency 2x / week   PT Duration 12 weeks   PT Treatment/Interventions Patient/family education;ADLs/Self Care Home Management;Cryotherapy;Electrical Stimulation;Iontophoresis 4mg /ml Dexamethasone;Moist Heat;Ultrasound;Dry needling;Manual techniques;Vasopneumatic Device;Therapeutic activities;Therapeutic exercise   PT Next Visit Plan estim for muscle re-ed, progress HEP per protocol, as tolerated.    Consulted and Agree with Plan of Care Patient      Patient will benefit from skilled therapeutic intervention in order to improve the following deficits and impairments:  Postural dysfunction, Improper body mechanics, Pain, Decreased range of motion, Decreased mobility, Decreased strength, Abnormal gait, Increased fascial restricitons, Increased muscle spasms, Decreased activity tolerance  Visit Diagnosis: Acute pain of right knee  Weakness generalized  Other abnormalities of gait and mobility     Problem List There are no active problems to display for this patient.  Mayer CamelJennifer Carlson-Long, PTA 10/19/16 1:14  PM  [facility] [address] [place], [place], [address] Phone: [phone]   Fax:  [phone]  Name: Valerie Prowsshley [name] Barnett MRN: [id number] Date of Birth: 1970/12/25

## 2016-10-19 NOTE — Patient Instructions (Addendum)
Step-Up: Forward    Leading with right leg, bring both feet onto __6__ inch step. Return to starting position, leading with left leg. Repeat __20__ times per session. Do _2___ sessions per day.  Repeat to the side leading with Rt leg. (up with Right, down with Left)  Heel Raises    Stand with support. With knees straight, raise heels off ground. Hold _2__ seconds. Relax for _2__ seconds. Repeat __10-20_ times. Do _1__ times a day.  Mini-Squats (Standing)    Stand with support. Bend knees slightly (40 degress, MINI!). Hold for _a couple__ seconds. Return to straight standing. 10 reps.    Balance: Unilateral    Attempt to balance on left leg, eyes open. Hold __30__ seconds. Repeat __3__ times per set. Do __1__ sets per session. Do ___1_ sessions per day. Perform exercise with eyes closed.  [facility] [address] [place] [address] [place], [place] [address]  [phone] (office) [phone])

## 2016-10-27 ENCOUNTER — Ambulatory Visit (INDEPENDENT_AMBULATORY_CARE_PROVIDER_SITE_OTHER): Payer: 59 | Admitting: Physical Therapy

## 2016-10-27 DIAGNOSIS — M25561 Pain in right knee: Secondary | ICD-10-CM | POA: Diagnosis not present

## 2016-10-27 DIAGNOSIS — R2689 Other abnormalities of gait and mobility: Secondary | ICD-10-CM | POA: Diagnosis not present

## 2016-10-27 DIAGNOSIS — R531 Weakness: Secondary | ICD-10-CM | POA: Diagnosis not present

## 2016-10-27 NOTE — Therapy (Signed)
[place], [place], [address] Phone: [phone]   Fax:  [phone]  Physical Therapy Treatment  Patient Details  Name: Valerie Lambert MRN: [id number] Date of Birth: Apr 07, 1971 Referring Provider: Dr. Hart Robinsons  Encounter Date: 10/27/2016      PT End of Session - 10/27/16 1030    Visit Number 8   Number of Visits 24   Date for PT Re-Evaluation 12/18/16   PT Start Time 1018   PT Stop Time 1118   PT Time Calculation (min) 60 min   Activity Tolerance Patient tolerated treatment well;No increased pain   Behavior During Therapy [facility] for tasks assessed/performed      No past medical history on file.  No past surgical history on file.  There were no vitals filed for this visit.      Subjective Assessment - 10/27/16 1230    Subjective Pt returns after being gone to beach for full week. She is very excited that she is now able to get a (Rt) quad contraction without a towel under her knee. She has been diligently doing HEP, wearing locked brace outside of her HEP exercises.  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    Patient Stated Goals get knee working again so she can return to normal activities    Currently in Pain? No/denies   Pain Score 0-No pain            [facility] PT Assessment - 10/27/16 0001      AROM   Right Knee Extension -2  supine, with quad set.  0 degrees when in Long sitting.    Right Knee Flexion 108  supine, heel slide     Flexibility   Quadriceps Rt knee 90 deg          [facility] Adult PT Treatment/Exercise - 10/27/16 0001      Knee/Hip Exercises: Stretches   Passive Hamstring Stretch Right;2 reps;30 seconds   Quad Stretch Right;3 reps;30 seconds   Gastroc Stretch Right;Left;3 reps;30 seconds     Knee/Hip Exercises: Aerobic   Recumbent Bike partial revolutions to full revolutions (no resistance) x 6 min     Knee/Hip Exercises: Standing   Forward Step Up Right;1 set;5 reps   SLS RLE, 20 sec x 2 reps   Gait Training gait with supervision. step through pattern with VC for heel strike, decrease Rt step length while increasing Lt step length, increased Rt toe off and increased Rt knee bend during swing through. 320 ft total; improved pattern with increased distance and VC.    Other Standing Knee Exercises Rt TKE with tactile cues x 3 sec x 5 reps; repeated with green band behind knee, 5 sec hold x 10 reps.      Knee/Hip Exercises: Seated   Other Seated Knee/Hip Exercises seated scoots x 10 sec, x 10 reps    Sit to Sand 1 set;10 reps;without UE support  Lt foot forward, focus on eccentric lowering to low mat     Knee/Hip Exercises: Supine   Quad Sets Right;1 set;10 reps  10 sec   Heel Slides AAROM;Right;1 set;5 reps  for measurement   Straight Leg Raises Limitations 10 reps, 5 sec hold at 10" off matt.  Improved quad contraction, less lag.      Architectural technologist Parameters to pt tolerance  Electrical Stimulation Goals Edema     Vasopneumatic   Number Minutes Vasopneumatic  15 minutes   Vasopnuematic Location  Knee  Rt   Vasopneumatic Pressure Low   Vasopneumatic Temperature  34 deg                   PT Short Term Goals - 10/27/16 1052      PT SHORT TERM GOAL #1   Title Increase AROM Rt knee 0-115 deg 11/06/16   Time 6   Period Weeks   Status On-going     PT SHORT TERM GOAL #2   Title Independent gait with least assistive device and good gait pattern 11/06/16   Period Weeks   Status Partially Met     PT SHORT TERM GOAL #3   Title Independent in initial HEP 11/06/16   Time 6   Period Weeks   Status On-going           PT Long Term Goals - 10/05/16 1251      PT LONG TERM GOAL #1   Title Full ROM Rt knee 12/18/16    Time 12   Period Weeks   Status On-going     PT LONG  TERM GOAL #2   Title 5/5 strength Rt LE 12/18/16   Time 12   Period Weeks   Status On-going     PT LONG TERM GOAL #3   Title Normal gait pattern without assistive device 12/18/16   Time 12   Period Weeks   Status On-going     PT LONG TERM GOAL #4   Title Independent in HEP 12/18/16   Time 12   Period Weeks   Status On-going     PT LONG TERM GOAL #5   Title Improve FOTO to </= 41% limitation 12/18/16   Time 12   Period Weeks   Status On-going               Plan - 10/27/16 1232    Clinical Impression Statement Pt is now in her 6th wk post-ACL surgery; ambulates in locked brace without AD.  Continues to have some swelling in Rt knee.  She has been making gradual improvements in Rt knee ROM (now lacking 2 deg ext, 108 deg flexion) and Rt quad contraction.  Extensor lag with SLR now minimal. Gait quality improving with cues and repetition.  Pt has partially met STG#2, met STG #3.  Making gradual gains towards LTG.    Rehab Potential Good   PT Frequency 2x / week   PT Duration 12 weeks   PT Treatment/Interventions Patient/family education;ADLs/Self Care Home Management;Cryotherapy;Electrical Stimulation;Iontophoresis 48m/ml Dexamethasone;Moist Heat;Ultrasound;Dry needling;Manual techniques;Vasopneumatic Device;Therapeutic activities;Therapeutic exercise   PT Next Visit Plan estim for muscle re-ed if needed, progress HEP per protocol, as tolerated.    Consulted and Agree with Plan of Care Patient      Patient will benefit from skilled therapeutic intervention in order to improve the following deficits and impairments:  Postural dysfunction, Improper body mechanics, Pain, Decreased range of motion, Decreased mobility, Decreased strength, Abnormal gait, Increased fascial restricitons, Increased muscle spasms, Decreased activity tolerance  Visit Diagnosis: Acute pain of right knee  Weakness generalized  Other abnormalities of gait and mobility     Problem List There are no  active problems to display for this patient.  JKerin Perna PTA 10/27/16 12:46 PM  [place] [address]Phone: [phone]  Fax:  [phone]  Name: Valerie Lambert MRN: [id number] Date of Birth: [birth year]

## 2016-10-30 ENCOUNTER — Ambulatory Visit (INDEPENDENT_AMBULATORY_CARE_PROVIDER_SITE_OTHER): Payer: 59 | Admitting: Physical Therapy

## 2016-10-30 DIAGNOSIS — R2689 Other abnormalities of gait and mobility: Secondary | ICD-10-CM | POA: Diagnosis not present

## 2016-10-30 DIAGNOSIS — R531 Weakness: Secondary | ICD-10-CM | POA: Diagnosis not present

## 2016-10-30 DIAGNOSIS — M25561 Pain in right knee: Secondary | ICD-10-CM | POA: Diagnosis not present

## 2016-10-30 NOTE — Therapy (Signed)
[place], [place], [address] Phone: [phone]   Fax:  [phone]  Physical Therapy Treatment  Patient Details  Name: Valerie Lambert MRN: [id number] Date of Birth: [birth year] Referring Provider: Dr. Hart Robinsons  Encounter Date: 10/30/2016      PT End of Session - 10/30/16 1038    Visit Number 9   Number of Visits 24   Date for PT Re-Evaluation 12/18/16   PT Start Time 1016   PT Stop Time 1113   PT Time Calculation (min) 57 min   Activity Tolerance Patient tolerated treatment well   Behavior During Therapy [facility] for tasks assessed/performed      No past medical history on file.  No past surgical history on file.  There were no vitals filed for this visit.      Subjective Assessment - 10/30/16 1038    Subjective Pt reports no new changes since last visit.  She is anxious to get different brace and be released to drive/ return to work.    Currently in Pain? No/denies   Pain Score 0-No pain            [facility] PT Assessment - 10/30/16 0001      Assessment   Medical Diagnosis Rt ACL repair    Referring Provider Dr. Hart Robinsons   Onset Date/Surgical Date 09/21/16   Hand Dominance Right   Next MD Visit 10/30/16     AROM   Right Knee Extension -2  supine, with quad set   Right Knee Flexion 110  supine                     [facility] Adult PT Treatment/Exercise - 10/30/16 0001      Knee/Hip Exercises: Stretches   Passive Hamstring Stretch Right;2 reps;30 seconds   Quad Stretch Right;3 reps;30 seconds   Gastroc Stretch Right;Left;3 reps;30 seconds     Knee/Hip Exercises: Aerobic   Recumbent Bike partial revolutions x 3 min, to full revolutions x 3 min (L1)      Knee/Hip Exercises: Standing   Knee Flexion AROM;Right;1 set;10 reps   Knee Flexion Limitations limited range   Lateral Step Up Right;1 set;10 reps   Forward Step Up Right;1 set;10 reps;Hand Hold:  2;Step Height: 6"   Step Down Left;1 set;10 reps;Hand Hold: 2;Step Height: 6"   Stairs reciprocal pattern x 20 steps with BUE on rail.    SLS RLE, 20 sec x 2 reps   Gait Training gait with supervision. step through pattern with VC for heel strike, decrease Rt step length while increasing Lt step length, increased Rt toe off and increased Rt knee bend during swing through. 160 ft total; improved pattern with increased distance and VC.      Knee/Hip Exercises: Seated   Other Seated Knee/Hip Exercises seated scoots x 10 sec, x 10 reps      Knee/Hip Exercises: Supine   Quad Sets Right;1 set;10 reps  10 sec   Heel Slides AAROM;Right;1 set;5 reps  for measurement   Straight Leg Raises Limitations 10 reps, 5 sec hold at 10" off matt.  Improved quad contraction, less lag.      Architectural technologist Parameters to pt tolerance   Electrical Stimulation Goals Edema     Vasopneumatic   Number Minutes Vasopneumatic  15 minutes   Vasopnuematic Location  Knee  Rt   Vasopneumatic Pressure Low   Vasopneumatic Temperature  34 deg      Manual Therapy   Manual Therapy Passive ROM;Myofascial release   Myofascial Release to Rt quad    Passive ROM over pressure to Rt knee into ext 3 reps; Rt knee flexion with pt in supine x 3 reps                  PT Short Term Goals - 10/27/16 1052      PT SHORT TERM GOAL #1   Title Increase AROM Rt knee 0-115 deg 11/06/16   Time 6   Period Weeks   Status On-going     PT SHORT TERM GOAL #2   Title Independent gait with least assistive device and good gait pattern 11/06/16   Period Weeks   Status Partially Met     PT SHORT TERM GOAL #3   Title Independent in initial HEP 11/06/16   Time 6   Period Weeks   Status On-going           PT Long Term Goals - 10/05/16 1251      PT LONG TERM GOAL #1   Title Full ROM Rt knee 12/18/16     Time 12   Period Weeks   Status On-going     PT LONG TERM GOAL #2   Title 5/5 strength Rt LE 12/18/16   Time 12   Period Weeks   Status On-going     PT LONG TERM GOAL #3   Title Normal gait pattern without assistive device 12/18/16   Time 12   Period Weeks   Status On-going     PT LONG TERM GOAL #4   Title Independent in HEP 12/18/16   Time 12   Period Weeks   Status On-going     PT LONG TERM GOAL #5   Title Improve FOTO to </= 41% limitation 12/18/16   Time 12   Period Weeks   Status On-going               Plan - 10/30/16 1301    Clinical Impression Statement Pt demonstrating slow gains with Rt quad strength and Rt knee ROM.  Pt able to make complete forward revolutions on bike today.  Gait quality improving with cues.     Rehab Potential Good   PT Frequency 2x / week   PT Duration 12 weeks   PT Treatment/Interventions Patient/family education;ADLs/Self Care Home Management;Cryotherapy;Electrical Stimulation;Iontophoresis 60m/ml Dexamethasone;Moist Heat;Ultrasound;Dry needling;Manual techniques;Vasopneumatic Device;Therapeutic activities;Therapeutic exercise   PT Next Visit Plan estim for muscle re-ed if needed, progress HEP per protocol, as tolerated.    Consulted and Agree with Plan of Care Patient      Patient will benefit from skilled therapeutic intervention in order to improve the following deficits and impairments:  Postural dysfunction, Improper body mechanics, Pain, Decreased range of motion, Decreased mobility, Decreased strength, Abnormal gait, Increased fascial restricitons, Increased muscle spasms, Decreased activity tolerance  Visit Diagnosis: Acute pain of right knee  Weakness generalized  Other abnormalities of gait and mobility     Problem List There are no active problems to display for this patient.  JKerin Perna PTA 10/30/16 1:13 PM  [place] [address]Phone: [phone]  Fax:  [phone] Name: Valerie ElsasserMRN: [id number] of Birth: [birth year]

## 2016-11-02 ENCOUNTER — Ambulatory Visit (INDEPENDENT_AMBULATORY_CARE_PROVIDER_SITE_OTHER): Payer: 59 | Admitting: Physical Therapy

## 2016-11-02 DIAGNOSIS — R2689 Other abnormalities of gait and mobility: Secondary | ICD-10-CM | POA: Diagnosis not present

## 2016-11-02 DIAGNOSIS — R531 Weakness: Secondary | ICD-10-CM

## 2016-11-02 DIAGNOSIS — M25561 Pain in right knee: Secondary | ICD-10-CM

## 2016-11-02 NOTE — Therapy (Signed)
Valerie Lambert, [place], [address] Phone: [phone]   Fax:  [phone]  Physical Therapy Treatment  Patient Details  Name: Valerie Lambert MRN: [id number] Date of Birth: Mar 25, 1971 Referring Provider: Dr. Hart Lambert  Encounter Date: 11/02/2016      PT End of Session - 11/02/16 1027    Visit Number 10   Number of Visits 24   Date for PT Re-Evaluation 12/18/16   PT Start Time 1018   PT Stop Time 1123   PT Time Calculation (min) 65 min      No past medical history on file.  No past surgical history on file.  There were no vitals filed for this visit.      Subjective Assessment - 11/02/16 1029    Subjective Valerie Lambert visited her MD this week.  He is pleased with progress; wants her to focus on extension.  He released her drive, but wrote her out of work for an additional 6 wks.  She has returned to using her pre-surgery brace only when walking.    Currently in Pain? Yes   Pain Score 4    Pain Location Hip   Pain Orientation Right   Pain Descriptors / Indicators Sore   Aggravating Factors  ??   Pain Relieving Factors ??            [facility] PT Assessment - 11/02/16 0001      Assessment   Medical Diagnosis Rt ACL repair    Referring Provider Dr. Hart Lambert   Onset Date/Surgical Date 09/21/16   Hand Dominance Right          [facility] Adult PT Treatment/Exercise - 11/02/16 0001      Knee/Hip Exercises: Stretches   Passive Hamstring Stretch Right;2 reps;30 seconds   Quad Stretch Right;30 seconds;4 reps  towel above knee   Piriformis Stretch Right;1 rep   Gastroc Stretch Right;Left;3 reps;30 seconds     Knee/Hip Exercises: Aerobic   Tread Mill backwards @ 0.6 x 2 min (focus on TKE Rt)   Recumbent Bike L1: 6.5 min      Knee/Hip Exercises: Standing   Lateral Step Up Right;1 set;10 reps;Hand Hold: 0;Step Height: 6"   Forward Step Up Right;1 set;10 reps;Hand Hold: 0   Functional Squat 1 set;10 reps   Wall Squat 1 set;10 reps;5 seconds  to 40 deg   Stairs reciprocal pattern x 10 steps with BUE on rail.    Gait Training gait with supervision. step through pattern with VC for heel strike, decrease Rt step length while increasing Lt step length, increased Rt hip ext, even stance time,  narrow base of support, and increased Rt knee bend during swing through. 360 ft total; improved pattern with increased distance and VC.      Vasopneumatic   Number Minutes Vasopneumatic  15 minutes   Vasopnuematic Location  Knee   Vasopneumatic Pressure Low   Vasopneumatic Temperature  34 deg                   PT Short Term Goals - 11/02/16 1301      PT SHORT TERM GOAL #1   Title Increase AROM Rt knee 0-115 deg 11/06/16   Time 6   Period Weeks   Status On-going     PT SHORT TERM GOAL #2   Title Independent gait with least assistive device and good gait pattern 11/06/16   Time 6   Period Weeks   Status  Partially Met     PT SHORT TERM GOAL #3   Title Independent in initial HEP 11/06/16   Time 6   Period Weeks   Status Achieved           PT Long Term Goals - 11/02/16 1301      PT LONG TERM GOAL #1   Title Full ROM Rt knee 12/18/16    Time 12   Period Weeks   Status On-going     PT LONG TERM GOAL #2   Title 5/5 strength Rt LE 12/18/16   Time 12   Period Weeks   Status On-going     PT LONG TERM GOAL #3   Title Normal gait pattern without assistive device 12/18/16   Time 12   Period Weeks   Status Partially Met     PT LONG TERM GOAL #4   Title Independent in HEP 12/18/16   Time 12   Period Weeks   Status On-going     PT LONG TERM GOAL #5   Title Improve FOTO to </= 41% limitation 12/18/16   Time 12   Period Weeks   Status On-going               Plan - 11/02/16 1258    Clinical Impression Statement Pt now able to ride recumbent bicycle at L1 (improved Rt knee flexion).  Gait quality slowly improving with frequent cues and  increased distance.  Pt tolerated all exercises without increase in symptoms.  Progressing towards goals.    Rehab Potential Good   PT Frequency 2x / week   PT Duration 12 weeks   PT Treatment/Interventions Patient/family education;ADLs/Self Care Home Management;Cryotherapy;Electrical Stimulation;Iontophoresis 62m/ml Dexamethasone;Moist Heat;Ultrasound;Dry needling;Manual techniques;Vasopneumatic Device;Therapeutic activities;Therapeutic exercise   PT Next Visit Plan Continue progressive Rt knee ROM/ strength per ACL protocol.    Consulted and Agree with Plan of Care Patient      Patient will benefit from skilled therapeutic intervention in order to improve the following deficits and impairments:  Postural dysfunction, Improper body mechanics, Pain, Decreased range of motion, Decreased mobility, Decreased strength, Abnormal gait, Increased fascial restricitons, Increased muscle spasms, Decreased activity tolerance  Visit Diagnosis: Acute pain of right knee  Weakness generalized  Other abnormalities of gait and mobility     Problem List There are no active problems to display for this patient.  JKerin Perna PTA 11/02/16 1:02 PM  [place] [address]Phone: [phone]  Fax:  [phone] Name: AAmos GaberMRN: [id number] of Birth: 1September 14, 1972

## 2016-11-06 ENCOUNTER — Encounter: Payer: 59 | Admitting: Physical Therapy

## 2016-11-07 ENCOUNTER — Ambulatory Visit (INDEPENDENT_AMBULATORY_CARE_PROVIDER_SITE_OTHER): Payer: 59 | Admitting: Physical Therapy

## 2016-11-07 DIAGNOSIS — M25561 Pain in right knee: Secondary | ICD-10-CM

## 2016-11-07 DIAGNOSIS — R2689 Other abnormalities of gait and mobility: Secondary | ICD-10-CM | POA: Diagnosis not present

## 2016-11-07 DIAGNOSIS — R531 Weakness: Secondary | ICD-10-CM

## 2016-11-07 NOTE — Therapy (Signed)
[place], [place], [address] Phone: [phone]   Fax:  [phone]  Physical Therapy Treatment  Patient Details  Name: Valerie Lambert Lambert MRN: [id number] Date of Birth: 08-05-1970 Referring Provider: Dr. Hart Lambert   Encounter Date: 11/07/2016      PT End of Session - 11/07/16 0858    Visit Number 11   Number of Visits 24   Date for PT Re-Evaluation 12/18/16   PT Start Time 6438  pt arrived late   PT Stop Time 0943  ice last 12 min    PT Time Calculation (min) 51 min      No past medical history on file.  No past surgical history on file.  There were no vitals filed for this visit.      Subjective Assessment - 11/07/16 1038    Subjective Pt [place] distance at mall (with brace on); states she tried to focus on a good gait pattern.  She will try to get on stationary bike at in-laws house over next 2 days.  She plans to return to work around 11/25/16.    Currently in Pain? No/denies   Pain Score 0-No pain            [facility] PT Assessment - 11/07/16 0001      Assessment   Medical Diagnosis Rt ACL repair    Referring Provider Dr. Hart Lambert    Onset Date/Surgical Date 09/21/16   Hand Dominance Right   Next MD Visit 12/11/16     AROM   Right/Left Knee Right  measurement in supine   Right Knee Extension -1   Right Knee Flexion 112          [facility] Adult PT Treatment/Exercise - 11/07/16 0001      Knee/Hip Exercises: Stretches   Passive Hamstring Stretch Right;2 reps;30 seconds   Quad Stretch Right;30 seconds;4 reps  towel above knee   Gastroc Stretch Right;Left;3 reps;30 seconds     Knee/Hip Exercises: Standing   Hip Flexion AROM;Knee bent;10 reps   Lateral Step Up Right;1 set;10 reps;Hand Hold: 0;Step Height: 8"   Forward Step Up Right;1 set;10 reps;Hand Hold: 0;Step Height: 8"   Wall Squat 1 set;10 reps;5 seconds  mini squat, with ball squeeze   SLS RLE x  30 sec x 2 reps with ball toss.    Other Standing Knee Exercises Rt hamstring curls (no wt) x 10 (limited range, some compensatory motions)     Knee/Hip Exercises: Seated   Other Seated Knee/Hip Exercises seated scoots x 10 sec, x 10 reps      Knee/Hip Exercises: Supine   Quad Sets Right;1 set;5 reps  10 sec    Heel Slides AAROM;Right;1 set;5 reps  for measurement     Modalities   Modalities Cryotherapy     Cryotherapy   Number Minutes Cryotherapy 12 Minutes   Cryotherapy Location Knee  Rt   Type of Cryotherapy Ice pack                  PT Short Term Goals - 11/02/16 1301      PT SHORT TERM GOAL #1   Title Increase AROM Rt knee 0-115 deg 11/06/16   Time 6   Period Weeks   Status On-going     PT SHORT TERM GOAL #2   Title Independent gait with least assistive device and good gait pattern 11/06/16   Time 6   Period Weeks  Status Partially Met     PT SHORT TERM GOAL #3   Title Independent in initial HEP 11/06/16   Time 6   Period Weeks   Status Achieved           PT Long Term Goals - 11/02/16 1301      PT LONG TERM GOAL #1   Title Full ROM Rt knee 12/18/16    Time 12   Period Weeks   Status On-going     PT LONG TERM GOAL #2   Title 5/5 strength Rt LE 12/18/16   Time 12   Period Weeks   Status On-going     PT LONG TERM GOAL #3   Title Normal gait pattern without assistive device 12/18/16   Time 12   Period Weeks   Status Partially Met     PT LONG TERM GOAL #4   Title Independent in HEP 12/18/16   Time 12   Period Weeks   Status On-going     PT LONG TERM GOAL #5   Title Improve FOTO to </= 41% limitation 12/18/16   Time 12   Period Weeks   Status On-going               Plan - 11/07/16 1036    Clinical Impression Statement Improved Rt quad contraction with quad set; continues to fatigue early.  she tolerated all exercises well without any symptoms, just fatigue. Making gradual gains towards established goals.    Rehab Potential  Good   PT Frequency 2x / week   PT Duration 12 weeks   PT Treatment/Interventions Patient/family education;ADLs/Self Care Home Management;Cryotherapy;Electrical Stimulation;Iontophoresis 82m/ml Dexamethasone;Moist Heat;Ultrasound;Dry needling;Manual techniques;Vasopneumatic Device;Therapeutic activities;Therapeutic exercise   PT Next Visit Plan Continue progressive Rt knee ROM/ strength per ACL protocol.    Consulted and Agree with Plan of Care Patient      Patient will benefit from skilled therapeutic intervention in order to improve the following deficits and impairments:  Postural dysfunction, Improper body mechanics, Pain, Decreased range of motion, Decreased mobility, Decreased strength, Abnormal gait, Increased fascial restricitons, Increased muscle spasms, Decreased activity tolerance  Visit Diagnosis: Weakness generalized  Acute pain of right knee  Other abnormalities of gait and mobility     Problem List There are no active problems to display for this patient.  JKerin Perna PTA 11/07/16 10:42 AM  [facility]1[place] [address]Phone: [phone]  Fax:  [phone] Name: Valerie ElsasserMRN: [id number] of Birth: 1Sep 29, 1972

## 2016-11-09 ENCOUNTER — Ambulatory Visit (INDEPENDENT_AMBULATORY_CARE_PROVIDER_SITE_OTHER): Payer: 59 | Admitting: Physical Therapy

## 2016-11-09 DIAGNOSIS — R2689 Other abnormalities of gait and mobility: Secondary | ICD-10-CM | POA: Diagnosis not present

## 2016-11-09 DIAGNOSIS — M25561 Pain in right knee: Secondary | ICD-10-CM | POA: Diagnosis not present

## 2016-11-09 DIAGNOSIS — R531 Weakness: Secondary | ICD-10-CM

## 2016-11-09 NOTE — Therapy (Signed)
[place], [place], [address] Phone: [phone]   Fax:  [phone]  Physical Therapy Treatment  Patient Details  Name: Valerie Lambert MRN: [id number] Date of Birth: 06-28-1971 Referring Provider: Dr. Hart Robinsons   Encounter Date: 11/09/2016      PT End of Session - 11/09/16 1124    Visit Number 12   Number of Visits 24   Date for PT Re-Evaluation 12/18/16   PT Start Time 1101   PT Stop Time 1201  ice last 12 min    PT Time Calculation (min) 60 min      No past medical history on file.  No past surgical history on file.  There were no vitals filed for this visit.      Subjective Assessment - 11/09/16 1125    Subjective Pt reports no new changes since last visit.    Currently in Pain? No/denies   Pain Score 0-No pain            [facility] PT Assessment - 11/09/16 0001      Assessment   Medical Diagnosis Rt ACL repair    Referring Provider Dr. Hart Robinsons    Onset Date/Surgical Date 09/21/16   Hand Dominance Right   Next MD Visit 12/11/16     AROM   Right Knee Flexion 114  supine          [facility] Adult PT Treatment/Exercise - 11/09/16 0001      Knee/Hip Exercises: Stretches   Passive Hamstring Stretch Right;2 reps;30 seconds   Quad Stretch Right;30 seconds;4 reps  towel above knee   Gastroc Stretch Right;Left;3 reps;30 seconds     Knee/Hip Exercises: Aerobic   Tread Mill backwards @ 0.6-0.9 x 2.5 min (focus on TKE Rt)   Recumbent Bike L2: 5.5 min      Knee/Hip Exercises: Standing   Heel Raises Both;1 set;15 reps   Knee Flexion AROM;Right;1 set;10 reps   Knee Flexion Limitations limited range   Wall Squat 1 set;10 reps;5 seconds  mini squat, with ball squeeze   Rebounder Rt SLS with ball toss x 10-15 throws x 3 reps    Other Standing Knee Exercises Rt TKE with ball against back of knee into wall x 5 sec x 10 reps      Knee/Hip Exercises: Seated   Other  Seated Knee/Hip Exercises seated scoots x 10 sec, x 10 reps      Knee/Hip Exercises: Supine   Heel Slides AAROM;Right;1 set;5 reps  for measurement     Cryotherapy   Number Minutes Cryotherapy 12 Minutes   Cryotherapy Location Knee  Rt   Type of Cryotherapy Ice pack     Kinesiotix   Edema web pattern of Rock tape applied to ant Rt knee (over incisions-assist with scar management) to reduce edema.            PT Short Term Goals - 11/02/16 1301      PT SHORT TERM GOAL #1   Title Increase AROM Rt knee 0-115 deg 11/06/16   Time 6   Period Weeks   Status On-going     PT SHORT TERM GOAL #2   Title Independent gait with least assistive device and good gait pattern 11/06/16   Time 6   Period Weeks   Status Partially Met     PT SHORT TERM GOAL #3   Title Independent in initial HEP 11/06/16   Time 6   Period Weeks  Status Achieved           PT Long Term Goals - 11/02/16 1301      PT LONG TERM GOAL #1   Title Full ROM Rt knee 12/18/16    Time 12   Period Weeks   Status On-going     PT LONG TERM GOAL #2   Title 5/5 strength Rt LE 12/18/16   Time 12   Period Weeks   Status On-going     PT LONG TERM GOAL #3   Title Normal gait pattern without assistive device 12/18/16   Time 12   Period Weeks   Status Partially Met     PT LONG TERM GOAL #4   Title Independent in HEP 12/18/16   Time 12   Period Weeks   Status On-going     PT LONG TERM GOAL #5   Title Improve FOTO to </= 41% limitation 12/18/16   Time 12   Period Weeks   Status On-going               Plan - 11/09/16 1255    Clinical Impression Statement Rt knee flexion continues to show improvement with each visit.  Pt tolerated all exercises with mild soreness afterward; reduced with use of ice at end of session. Pt progressing towards goals.    Rehab Potential Good   PT Frequency 2x / week   PT Duration 12 weeks   PT Treatment/Interventions Patient/family education;ADLs/Self Care Home  Management;Cryotherapy;Electrical Stimulation;Iontophoresis 55m/ml Dexamethasone;Moist Heat;Ultrasound;Dry needling;Manual techniques;Vasopneumatic Device;Therapeutic activities;Therapeutic exercise   PT Next Visit Plan Continue progressive Rt knee ROM/ strength per ACL protocol.    Consulted and Agree with Plan of Care Patient      Patient will benefit from skilled therapeutic intervention in order to improve the following deficits and impairments:  Postural dysfunction, Improper body mechanics, Pain, Decreased range of motion, Decreased mobility, Decreased strength, Abnormal gait, Increased fascial restricitons, Increased muscle spasms, Decreased activity tolerance  Visit Diagnosis: Weakness generalized  Acute pain of right knee  Other abnormalities of gait and mobility     Problem List There are no active problems to display for this patient.  JKerin Perna PTA 11/09/16 12:58 PM  [place] [address]Phone: [phone]  Fax:  [phone] Name: AChanell NadeauMRN: [id number] of Birth: 109-25-1972

## 2016-11-13 ENCOUNTER — Ambulatory Visit (INDEPENDENT_AMBULATORY_CARE_PROVIDER_SITE_OTHER): Payer: 59 | Admitting: Physical Therapy

## 2016-11-13 DIAGNOSIS — M25561 Pain in right knee: Secondary | ICD-10-CM | POA: Diagnosis not present

## 2016-11-13 DIAGNOSIS — R531 Weakness: Secondary | ICD-10-CM | POA: Diagnosis not present

## 2016-11-13 DIAGNOSIS — R2689 Other abnormalities of gait and mobility: Secondary | ICD-10-CM | POA: Diagnosis not present

## 2016-11-13 NOTE — Therapy (Signed)
[place], [place], [address] Phone: [phone]   Fax:  [phone]  Physical Therapy Treatment  Patient Details  Name: Valerie Lambert MRN: [id number] Date of Birth: May 26, 1971 Referring Provider: Dr. Hart Robinsons   Encounter Date: 11/13/2016      PT End of Session - 11/13/16 1023    Visit Number 13   Number of Visits 24   Date for PT Re-Evaluation 12/18/16   PT Start Time 1022  pt arrived late   PT Stop Time 1117  ice last 12 min    PT Time Calculation (min) 55 min      No past medical history on file.  No past surgical history on file.  There were no vitals filed for this visit.      Subjective Assessment - 11/13/16 1024    Subjective Pt spent Saturday at a cheerleading event, then a concert.  She noticed her ankle was swollen that night, but it resolved by next day.    Currently in Pain? No/denies   Pain Score 0-No pain            [facility] PT Assessment - 11/13/16 0001      Assessment   Medical Diagnosis Rt ACL repair    Referring Provider Dr. Hart Robinsons    Onset Date/Surgical Date 09/21/16   Hand Dominance Right   Next MD Visit 12/11/16     ROM / Strength   AROM / PROM / Strength Strength     AROM   Right/Left Knee Right   Right Knee Extension 0  after stretches      Strength   Strength Assessment Site Hip;Knee   Right/Left Hip Right   Right Hip Flexion 5/5   Right Hip Extension 4+/5   Right Hip ABduction 4-/5   Right/Left Knee Right   Right Knee Flexion 4-/5   Right Knee Extension --  5-/5     Flexibility   Quadriceps Rt knee 99 deg          [facility] Adult PT Treatment/Exercise - 11/13/16 0001      Knee/Hip Exercises: Stretches   Passive Hamstring Stretch Right;2 reps;30 seconds   Quad Stretch Right;30 seconds;4 reps  towel above knee   Gastroc Stretch Right;Left;3 reps;30 seconds     Knee/Hip Exercises: Aerobic   Recumbent Bike L2: 5.5  min      Knee/Hip Exercises: Standing   Knee Flexion AROM;Right;1 set;10 reps   Knee Flexion Limitations limited range, but improved   Hip Flexion Stengthening;20 reps;Knee bent  high knee marching, mirror for feedback   Hip Flexion Limitations some substitution noted in Rt hip.    Lateral Step Up Right;1 set;10 reps;Step Height: 8";Hand Hold: 1   Forward Step Up Right;1 set;10 reps;Step Height: 8";Hand Hold: 1   Wall Squat 1 set;10 reps;5 seconds  mini squat, with ball squeeze     Knee/Hip Exercises: Seated   Hamstring Curl Right;2 sets;10 reps  green band     Knee/Hip Exercises: Supine   Quad Sets Right;1 set;10 reps  10 sec hold, much improved.      Knee/Hip Exercises: Prone   Hamstring Curl 1 set;10 reps  between prone hang trials.    Prone Knee Hang 2 minutes  RLE, 2 trials.      Cryotherapy   Number Minutes Cryotherapy 12 Minutes   Cryotherapy Location Knee  Rt   Type of Cryotherapy Ice pack  PT Short Term Goals - 11/13/16 1230      PT SHORT TERM GOAL #1   Title Increase AROM Rt knee 0-115 deg 11/06/16   Time 6   Period Weeks   Status Partially Met     PT SHORT TERM GOAL #2   Title Independent gait with least assistive device and good gait pattern 11/06/16   Time 6   Period Weeks   Status Partially Met     PT SHORT TERM GOAL #3   Title Independent in initial HEP 11/06/16   Time 6   Period Weeks   Status Achieved           PT Long Term Goals - 11/13/16 1231      PT LONG TERM GOAL #1   Title Full ROM Rt knee 12/18/16    Time 12   Period Weeks   Status Partially Met     PT LONG TERM GOAL #2   Title 5/5 strength Rt LE 12/18/16   Time 12   Period Weeks   Status On-going     PT LONG TERM GOAL #3   Title Normal gait pattern without assistive device 12/18/16   Status Partially Met     PT LONG TERM GOAL #4   Title Independent in HEP 12/18/16   Time 12   Period Weeks   Status On-going     PT LONG TERM GOAL #5   Title  Improve FOTO to </= 41% limitation 12/18/16   Time 12   Period Weeks   Status On-going               Plan - 11/13/16 1234    Clinical Impression Statement Pt able to demonstrate a good quad set for 10 full seconds x 10 reps, without early fatigue.  She continues with some gait abnormalities, compensatory strategies observed when pt walking between exercises.  Pt's quad flexibility has improved 9 deg since last assessment.  Pt making gradual progress towards established goals.    Rehab Potential Good   PT Frequency 2x / week   PT Duration 12 weeks   PT Treatment/Interventions Patient/family education;ADLs/Self Care Home Management;Cryotherapy;Electrical Stimulation;Iontophoresis 63m/ml Dexamethasone;Moist Heat;Ultrasound;Dry needling;Manual techniques;Vasopneumatic Device;Therapeutic activities;Therapeutic exercise   PT Next Visit Plan Continue progressive Rt knee ROM/ strength per ACL protocol.    Consulted and Agree with Plan of Care Patient      Patient will benefit from skilled therapeutic intervention in order to improve the following deficits and impairments:  Postural dysfunction, Improper body mechanics, Pain, Decreased range of motion, Decreased mobility, Decreased strength, Abnormal gait, Increased fascial restricitons, Increased muscle spasms, Decreased activity tolerance  Visit Diagnosis: Weakness generalized  Acute pain of right knee  Other abnormalities of gait and mobility     Problem List There are no active problems to display for this patient.  JKerin Perna PTA 11/13/16 12:39 PM  [place] [address]Phone: [phone]  Fax:  [phone] Name: ACathryn GalleryMRN: [id number] of Birth: 116-Dec-1972

## 2016-11-16 ENCOUNTER — Encounter: Payer: 59 | Admitting: Physical Therapy

## 2016-11-20 ENCOUNTER — Ambulatory Visit (INDEPENDENT_AMBULATORY_CARE_PROVIDER_SITE_OTHER): Payer: 59 | Admitting: Physical Therapy

## 2016-11-20 DIAGNOSIS — R2689 Other abnormalities of gait and mobility: Secondary | ICD-10-CM

## 2016-11-20 DIAGNOSIS — R531 Weakness: Secondary | ICD-10-CM

## 2016-11-20 DIAGNOSIS — M25561 Pain in right knee: Secondary | ICD-10-CM

## 2016-11-20 NOTE — Therapy (Signed)
[facility] [place] [address] [place], [place], [address] Phone: [phone]   Fax:  [phone]  Physical Therapy Treatment  Patient Details  Name: Valerie Lambert MRN: [id number] Date of Birth: 11-03-1970 Referring Provider: Dr. Valma Cava  Encounter Date: 11/20/2016      PT End of Session - 11/20/16 1022    Visit Number 14   Number of Visits 24   Date for PT Re-Evaluation 12/18/16   PT Start Time 1018   PT Stop Time 1114   PT Time Calculation (min) 56 min   Activity Tolerance Patient tolerated treatment well   Behavior During Therapy [facility] for tasks assessed/performed      No past medical history on file.  No past surgical history on file.  There were no vitals filed for this visit.      Subjective Assessment - 11/20/16 1022    Subjective Pt walked around [facility] for daughter's field trip.  She iced/elevated afterwards.  "I only regress to bad habits at the end of day when tired".  She feels her Rt knee is more straight.    Patient Stated Goals get knee working again so she can return to normal activities    Currently in Pain? No/denies   Pain Score 0-No pain   Pain Location Knee            [facility] PT Assessment - 11/20/16 0001      Assessment   Medical Diagnosis Rt ACL repair    Referring Provider Dr. Valma Cava   Onset Date/Surgical Date 09/21/16   Hand Dominance Right   Next MD Visit 12/11/16   Prior Therapy none     AROM   Right/Left Knee Right   Right Knee Extension 0   Right Knee Flexion 117          [facility] Adult PT Treatment/Exercise - 11/20/16 0001      Knee/Hip Exercises: Stretches   Passive Hamstring Stretch Right;2 reps;30 seconds   Quad Stretch Right;30 seconds;4 reps  towel above knee   Gastroc Stretch Right;Left;3 reps;30 seconds     Knee/Hip Exercises: Aerobic   Nustep L5: 5 min      Knee/Hip Exercises: Standing   Forward Step Up Right;1 set;10 reps;Hand Hold:  0;Step Height: 6"   SLS single leg squat to elevated high/low table x 10 reps on RLE, 5 reps on LLE; forward leans to chair height x 10 RLE, 5 LLE.    SLS with Vectors Rt SLS mini squats with toe taps front/side/back x 10 RLE, 5 rep LLE.   Other Standing Knee Exercises Lateral step overs (over cones) x 10 reps: Lateral lunges x 10 RLE, 5 reps LLE     Knee/Hip Exercises: Seated   Other Seated Knee/Hip Exercises seated scoots x 10 sec, x 5 reps      Vasopneumatic   Number Minutes Vasopneumatic  15 minutes   Vasopnuematic Location  Knee   Vasopneumatic Pressure Low   Vasopneumatic Temperature  34 deg                 PT Education - 11/20/16 1106    Education provided Yes   Education Details HEP    Person(s) Educated Patient   Methods Explanation;Handout;Demonstration   Comprehension Verbalized understanding;Returned demonstration          PT Short Term Goals - 11/20/16 1107      PT SHORT TERM GOAL #1   Title Increase AROM Rt knee 0-115  deg 11/06/16   Time 6   Period Weeks   Status Achieved     PT SHORT TERM GOAL #2   Title Independent gait with least assistive device and good gait pattern 11/06/16   Time 6   Period Weeks   Status Partially Met     PT SHORT TERM GOAL #3   Title Independent in initial HEP 11/06/16   Time 6   Period Weeks   Status Achieved           PT Long Term Goals - 11/13/16 1231      PT LONG TERM GOAL #1   Title Full ROM Rt knee 12/18/16    Time 12   Period Weeks   Status Partially Met     PT LONG TERM GOAL #2   Title 5/5 strength Rt LE 12/18/16   Time 12   Period Weeks   Status On-going     PT LONG TERM GOAL #3   Title Normal gait pattern without assistive device 12/18/16   Status Partially Met     PT LONG TERM GOAL #4   Title Independent in HEP 12/18/16   Time 12   Period Weeks   Status On-going     PT LONG TERM GOAL #5   Title Improve FOTO to </= 41% limitation 12/18/16   Time 12   Period Weeks   Status On-going                Plan - 11/20/16 1324    Clinical Impression Statement Pt making good gains in strength, balance and ROM in Rt knee. Now can hold a solid quad set for 10-15 sec without early fatigue, no towel under leg.   Pt has met her STG for ROM.  She tolerated all exercises well.     Rehab Potential Good   PT Frequency 2x / week   PT Duration 12 weeks   PT Treatment/Interventions Patient/family education;ADLs/Self Care Home Management;Cryotherapy;Electrical Stimulation;Iontophoresis 58m/ml Dexamethasone;Moist Heat;Ultrasound;Dry needling;Manual techniques;Vasopneumatic Device;Therapeutic activities;Therapeutic exercise   PT Next Visit Plan Continue progressive Rt knee ROM/ strength per ACL protocol.    Consulted and Agree with Plan of Care Patient      Patient will benefit from skilled therapeutic intervention in order to improve the following deficits and impairments:  Postural dysfunction, Improper body mechanics, Pain, Decreased range of motion, Decreased mobility, Decreased strength, Abnormal gait, Increased fascial restricitons, Increased muscle spasms, Decreased activity tolerance  Visit Diagnosis: Weakness generalized  Acute pain of right knee  Other abnormalities of gait and mobility     Problem List There are no active problems to display for this patient.  JKerin Perna PTA 11/20/16 1:25 PM  [facility] [address]Phone: [phone]  Fax:  [phone] Name: Valerie SchnetzerMRN: [id number] of Birth: 102/02/1971

## 2016-11-20 NOTE — Patient Instructions (Addendum)
Balance: Three-Way Leg Swing    Stand on Right foot, hands on hips. Reach other foot forward ___10_ times, sideways ___10_ times, back _10___ times. Hold each position _1-2___ seconds. Relax. Do a mini squat with each reach.   Warrior III    In forward lunge, hands on front thigh, shift onto front leg, lifting back leg in line with torso. Straighten standing leg, reaching arms out to sides for balance. Hold for _2___ breaths. Repeat, other leg forward. BEGINNER: reach hand to seat of chair.    [facility] [address] [place], [place] [address]  [phone] (office) [phone] (fax)

## 2016-11-23 ENCOUNTER — Encounter: Payer: 59 | Admitting: Physical Therapy

## 2016-11-29 ENCOUNTER — Ambulatory Visit (INDEPENDENT_AMBULATORY_CARE_PROVIDER_SITE_OTHER): Payer: 59 | Admitting: Physical Therapy

## 2016-11-29 DIAGNOSIS — M25561 Pain in right knee: Secondary | ICD-10-CM | POA: Diagnosis not present

## 2016-11-29 DIAGNOSIS — R2689 Other abnormalities of gait and mobility: Secondary | ICD-10-CM

## 2016-11-29 DIAGNOSIS — R531 Weakness: Secondary | ICD-10-CM

## 2016-11-29 NOTE — Therapy (Signed)
Valerie Lambert, [place], [address] Phone: [phone]   Fax:  [phone]  Physical Therapy Treatment  Patient Details  Name: Valerie Lambert MRN: [id number] Date of Birth: [birth year] Referring Provider: Dr. Hart Robinsons  Encounter Date: 11/29/2016      PT End of Session - 11/29/16 1021    Visit Number 15   Number of Visits 24   Date for PT Re-Evaluation 12/18/16   PT Start Time 1018   PT Stop Time 1115   PT Time Calculation (min) 57 min      No past medical history on file.  No past surgical history on file.  There were no vitals filed for this visit.      Subjective Assessment - 11/29/16 1022    Subjective Pt returns from week long trip to [facility].  She reports she did a lot of walking.  Her Rt hip would hurt at night, but resolved with rest. She iced/elevated each day. She is worried that her progress will have slipped.    Currently in Pain? No/denies   Pain Score 0-No pain            [facility] PT Assessment - 11/29/16 0001      Assessment   Medical Diagnosis Rt ACL repair    Referring Provider Dr. Hart Robinsons   Onset Date/Surgical Date 09/21/16   Hand Dominance Right   Next MD Visit 12/11/16   Prior Therapy none     AROM   Right/Left Knee Right;Left   Right Knee Extension 0   Right Knee Flexion 120   Left Knee Flexion 127          [facility] Adult PT Treatment/Exercise - 11/29/16 0001      Knee/Hip Exercises: Stretches   Passive Hamstring Stretch Right;2 reps;30 seconds   Quad Stretch Right;30 seconds;4 reps  towel above knee   Gastroc Stretch Right;Left;3 reps;30 seconds     Knee/Hip Exercises: Aerobic   Recumbent Bike L2: 9 min      Knee/Hip Exercises: Standing   Heel Raises Right;Left;1 set;5 reps  challenging   Heel Raises Limitations (single leg, equal heel height)   Functional Squat 1 set;10 reps  to black mat, mirror for visual feedback (even wt)   SLS Rt SLS  x 30 sec x 2 reps. single leg squat to elevated high/low table x 10 reps on RLE, 5 reps on LLE; forward leans to chair height x 10 RLE, 5 LLE.    SLS with Vectors Rt SLS mini squats with toe taps front/side/back x 10 RLE, 5 rep LLE.     Knee/Hip Exercises: Supine   Straight Leg Raise with External Rotation Strengthening;Right;2 sets;5 reps  (long sitting)   Straight Leg Raise with External Rotation Limitations with hip abdct/add     Modalities   Modalities Moist Heat;Vasopneumatic     Moist Heat Therapy   Number Minutes Moist Heat 15 Minutes   Moist Heat Location Hip  Rt     Vasopneumatic   Number Minutes Vasopneumatic  15 minutes   Vasopnuematic Location  Knee   Vasopneumatic Pressure Low   Vasopneumatic Temperature  34 deg                   PT Short Term Goals - 11/20/16 1107      PT SHORT TERM GOAL #1   Title Increase AROM Rt knee 0-115 deg 11/06/16   Time 6   Period Weeks  Status Achieved     PT SHORT TERM GOAL #2   Title Independent gait with least assistive device and good gait pattern 11/06/16   Time 6   Period Weeks   Status Partially Met     PT SHORT TERM GOAL #3   Title Independent in initial HEP 11/06/16   Time 6   Period Weeks   Status Achieved           PT Long Term Goals - 11/29/16 1132      PT LONG TERM GOAL #1   Title Full ROM Rt knee 12/18/16    Time 12   Period Weeks   Status Partially Met     PT LONG TERM GOAL #2   Title 5/5 strength Rt LE 12/18/16   Time 12   Period Weeks   Status On-going     PT LONG TERM GOAL #3   Title Normal gait pattern without assistive device 12/18/16   Time 12   Period Weeks   Status Partially Met     PT LONG TERM GOAL #4   Title Independent in HEP 12/18/16   Time 12   Period Weeks   Status On-going     PT LONG TERM GOAL #5   Title Improve FOTO to </= 41% limitation 12/18/16   Time 12   Period Weeks   Status On-going               Plan - 11/29/16 1134    Clinical Impression  Statement Pt demonstrated improved Rt knee flexion ROM.  She was challenged with Rt single leg stance exercises (mini squat, balance, forward leans) due to decreased proprioception and strength.  She continues to make gradual gains towards remaining goals.    Rehab Potential Good   PT Frequency 2x / week   PT Duration 12 weeks   PT Treatment/Interventions Patient/family education;ADLs/Self Care Home Management;Cryotherapy;Electrical Stimulation;Iontophoresis 42m/ml Dexamethasone;Moist Heat;Ultrasound;Dry needling;Manual techniques;Vasopneumatic Device;Therapeutic activities;Therapeutic exercise   PT Next Visit Plan Continue progressive Rt knee ROM/ strength per ACL protocol. Assess LE strength; progress HEP.    Consulted and Agree with Plan of Care Patient      Patient will benefit from skilled therapeutic intervention in order to improve the following deficits and impairments:  Postural dysfunction, Improper body mechanics, Pain, Decreased range of motion, Decreased mobility, Decreased strength, Abnormal gait, Increased fascial restricitons, Increased muscle spasms, Decreased activity tolerance  Visit Diagnosis: Weakness generalized  Acute pain of right knee  Other abnormalities of gait and mobility     Problem List There are no active problems to display for this patient.  JKerin Perna PTA 11/29/16 11:48 AM  [facility] [address]Phone: [phone]  Fax:  [phone] Name: Valerie FayetteMRN: [id number] of Birth: [birth year]

## 2016-12-01 ENCOUNTER — Ambulatory Visit (INDEPENDENT_AMBULATORY_CARE_PROVIDER_SITE_OTHER): Payer: 59 | Admitting: Physical Therapy

## 2016-12-01 DIAGNOSIS — R2689 Other abnormalities of gait and mobility: Secondary | ICD-10-CM

## 2016-12-01 DIAGNOSIS — M25561 Pain in right knee: Secondary | ICD-10-CM | POA: Diagnosis not present

## 2016-12-01 DIAGNOSIS — R531 Weakness: Secondary | ICD-10-CM | POA: Diagnosis not present

## 2016-12-01 NOTE — Therapy (Signed)
[place], [place], [address] Phone: [phone]   Fax:  [phone]  Physical Therapy Treatment  Patient Details  Name: Valerie Lambert MRN: [id number] Date of Birth: [birth year] Referring Provider: Dr. Hart Robinsons  Encounter Date: 12/01/2016      PT End of Session - 12/01/16 1103    Visit Number 16   Number of Visits 24   Date for PT Re-Evaluation 12/18/16   PT Start Time 1018   PT Stop Time 1115   PT Time Calculation (min) 57 min   Activity Tolerance Patient tolerated treatment well;No increased pain   Behavior During Therapy [facility] for tasks assessed/performed      No past medical history on file.  No past surgical history on file.  There were no vitals filed for this visit.      Subjective Assessment - 12/01/16 1306    Subjective Pt reports she was sore yesterday, "maybe it's left over from the trip to [place]".  No new changes since last visit.    Currently in Pain? No/denies   Pain Score 0-No pain            [facility] PT Assessment - 12/01/16 0001      Assessment   Medical Diagnosis Rt ACL repair    Referring Provider Dr. Hart Robinsons   Onset Date/Surgical Date 09/21/16   Hand Dominance Right   Next MD Visit 12/11/16   Prior Therapy none     Strength   Right Hip ABduction --  5-/5   Right Knee Flexion --  5-/5   Right Knee Extension 5/5          [facility] Adult PT Treatment/Exercise - 12/01/16 0001      Knee/Hip Exercises: Stretches   Passive Hamstring Stretch Right;4 reps;30 seconds   Quad Stretch Right;30 seconds;4 reps  towel above knee   Gastroc Stretch Right;Left;3 reps;30 seconds     Knee/Hip Exercises: Aerobic   Tread Mill 2 mi n@ 1.8 mph   Recumbent Bike L2: 5.5 min      Knee/Hip Exercises: Standing   Functional Squat --  3 reps with UE assist to sitting on low stool.    Functional Squat Limitations (to simulate getting up/down onto garden  bench.)   Lunge Walking - Round Trips 15 ft.    Stairs 30 steps, reciprocal pattern    SLS Rt SLS on blue pad with ball toss to rebounder x 50.    SLS with Vectors Rt SLS on blue pad angled at 10 and 2, ball toss to rebounder x 20 each x 2 sets    Other Standing Knee Exercises standing to high kneeling to modified childs pose x 3 reps (to simulate gardening)   Other Standing Knee Exercises split squats with knee touching 3" pad x 5 reps each leg.      Modalities   Modalities Vasopneumatic     Vasopneumatic   Number Minutes Vasopneumatic  15 minutes   Vasopnuematic Location  Knee   Vasopneumatic Pressure Low   Vasopneumatic Temperature  --  34 deg                  PT Short Term Goals - 11/20/16 1107      PT SHORT TERM GOAL #1   Title Increase AROM Rt knee 0-115 deg 11/06/16   Time 6   Period Weeks   Status Achieved     PT SHORT TERM GOAL #2  Title Independent gait with least assistive device and good gait pattern 11/06/16   Time 6   Period Weeks   Status Partially Met     PT SHORT TERM GOAL #3   Title Independent in initial HEP 11/06/16   Time 6   Period Weeks   Status Achieved           PT Long Term Goals - 11/29/16 1132      PT LONG TERM GOAL #1   Title Full ROM Rt knee 12/18/16    Time 12   Period Weeks   Status Partially Met     PT LONG TERM GOAL #2   Title 5/5 strength Rt LE 12/18/16   Time 12   Period Weeks   Status On-going     PT LONG TERM GOAL #3   Title Normal gait pattern without assistive device 12/18/16   Time 12   Period Weeks   Status Partially Met     PT LONG TERM GOAL #4   Title Independent in HEP 12/18/16   Time 12   Period Weeks   Status On-going     PT LONG TERM GOAL #5   Title Improve FOTO to </= 41% limitation 12/18/16   Time 12   Period Weeks   Status On-going               Plan - 12/01/16 1255    Clinical Impression Statement Pt tolerated new exercises including mini walking lunges and split squats.   Improved SLS balance.  Progressing well towards unmet goals.    Rehab Potential Good   PT Frequency 2x / week   PT Duration 12 weeks   PT Treatment/Interventions Patient/family education;ADLs/Self Care Home Management;Cryotherapy;Electrical Stimulation;Iontophoresis 82m/ml Dexamethasone;Moist Heat;Ultrasound;Dry needling;Manual techniques;Vasopneumatic Device;Therapeutic activities;Therapeutic exercise   PT Next Visit Plan Continue progressive Rt knee ROM/ strength per ACL protocol. progress HEP.       Patient will benefit from skilled therapeutic intervention in order to improve the following deficits and impairments:  Postural dysfunction, Improper body mechanics, Pain, Decreased range of motion, Decreased mobility, Decreased strength, Abnormal gait, Increased fascial restricitons, Increased muscle spasms, Decreased activity tolerance  Visit Diagnosis: Weakness generalized  Acute pain of right knee  Other abnormalities of gait and mobility     Problem List There are no active problems to display for this patient.  JKerin Perna PTA 12/01/16 1:07 PM  [place] [place] [address]Phone: [phone]  Fax:  [phone] Name: ADanyelle BrookoverMRN: [id number] of Birth: 109/21/1972

## 2016-12-04 ENCOUNTER — Ambulatory Visit (INDEPENDENT_AMBULATORY_CARE_PROVIDER_SITE_OTHER): Payer: 59 | Admitting: Physical Therapy

## 2016-12-04 DIAGNOSIS — R2689 Other abnormalities of gait and mobility: Secondary | ICD-10-CM | POA: Diagnosis not present

## 2016-12-04 DIAGNOSIS — M25561 Pain in right knee: Secondary | ICD-10-CM | POA: Diagnosis not present

## 2016-12-04 DIAGNOSIS — R531 Weakness: Secondary | ICD-10-CM

## 2016-12-04 NOTE — Therapy (Signed)
[place], [place], [address] Phone: [phone]   Fax:  [phone]  Physical Therapy Treatment  Patient Details  Name: Valerie Lambert MRN: [id number] Date of Birth: 05/22/1971 Referring Provider: Dr. Hart Robinsons  Encounter Date: 12/04/2016      PT End of Session - 12/04/16 1027    Visit Number 17   Number of Visits 24   Date for PT Re-Evaluation 12/18/16   PT Start Time 1018   PT Stop Time 1117   PT Time Calculation (min) 59 min   Activity Tolerance Patient tolerated treatment well;No increased pain   Behavior During Therapy [facility] for tasks assessed/performed      No past medical history on file.  No past surgical history on file.  There were no vitals filed for this visit.      Subjective Assessment - 12/04/16 1027    Subjective Pt reports no new changes.    Currently in Pain? No/denies   Pain Location Knee   Pain Orientation Right            [facility] PT Assessment - 12/04/16 0001      Assessment   Medical Diagnosis Rt ACL repair    Referring Provider Dr. Hart Robinsons   Onset Date/Surgical Date 09/21/16   Hand Dominance Right   Next MD Visit 12/11/16         [facility] Adult PT Treatment/Exercise - 12/04/16 0001      Knee/Hip Exercises: Stretches   Passive Hamstring Stretch Right;4 reps;30 seconds   Quad Stretch Right;30 seconds;4 reps  towel above knee   Gastroc Stretch Right;Left;3 reps;30 seconds   Soleus Stretch Right;Left;2 reps;30 seconds     Knee/Hip Exercises: Aerobic   Recumbent Bike L2: 5.5 min      Knee/Hip Exercises: Machines for Strengthening   Total Gym Leg Press 6 plates:  0-90 deg, 20 reps     Knee/Hip Exercises: Standing   Lateral Step Up Right;1 set;15 reps  Bosu   Forward Step Up Right;1 set;15 reps;Hand Hold: 1  onto Bosu   Functional Squat 1 set;15 reps  on upside Bosu.    SLS Rt SLS on blue pad with ball toss to rebounder x 50.   Rt  SLS on Bosu   SLS with Vectors Rt SLS on blue pad angled at 10 and 2, ball toss to rebounder x 20 each x 2 sets    Other Standing Knee Exercises split squats ( limited depth) x 10 reps each leg.                   PT Short Term Goals - 12/04/16 1128      PT SHORT TERM GOAL #1   Title Increase AROM Rt knee 0-115 deg 11/06/16   Time 6   Period Weeks   Status Achieved     PT SHORT TERM GOAL #2   Title Independent gait with least assistive device and good gait pattern 11/06/16   Time 6   Period Weeks   Status Achieved     PT SHORT TERM GOAL #3   Title Independent in initial HEP 11/06/16   Time 6   Period Weeks   Status Achieved           PT Long Term Goals - 12/04/16 1039      PT LONG TERM GOAL #1   Title Full ROM Rt knee 12/18/16    Time 12   Period Weeks  Status Partially Met     PT LONG TERM GOAL #2   Title 5/5 strength Rt LE 12/18/16   Time 12   Period Weeks   Status Partially Met     PT LONG TERM GOAL #3   Title Normal gait pattern without assistive device 12/18/16   Time 12   Period Weeks   Status Achieved     PT LONG TERM GOAL #4   Title Independent in HEP 12/18/16   Time 12   Period Weeks   Status On-going     PT LONG TERM GOAL #5   Title Improve FOTO to </= 41% limitation 12/18/16   Time 12   Period Weeks   Status On-going               Plan - 12/04/16 1107    Clinical Impression Statement Pt tolerated new exercises including higher level balance and leg press without any production of symptoms. Her gait quality (observed in clinic) has improved; has met STG#2, LTG#3.  Progressing well towards unmet goals.    Rehab Potential Good   PT Frequency 2x / week   PT Duration 12 weeks   PT Treatment/Interventions Patient/family education;ADLs/Self Care Home Management;Cryotherapy;Electrical Stimulation;Iontophoresis 62m/ml Dexamethasone;Moist Heat;Ultrasound;Dry needling;Manual techniques;Vasopneumatic Device;Therapeutic  activities;Therapeutic exercise   PT Next Visit Plan Continue progressive Rt knee ROM/ strength per ACL protocol. progress HEP.    Consulted and Agree with Plan of Care Patient      Patient will benefit from skilled therapeutic intervention in order to improve the following deficits and impairments:  Postural dysfunction, Improper body mechanics, Pain, Decreased range of motion, Decreased mobility, Decreased strength, Abnormal gait, Increased fascial restricitons, Increased muscle spasms, Decreased activity tolerance  Visit Diagnosis: Weakness generalized  Acute pain of right knee  Other abnormalities of gait and mobility     Problem List There are no active problems to display for this patient.  JKerin Perna PTA 12/04/16 11:29 AM  [facility]1[place] [address]Phone: [phone]  Fax:  [phone] Name: AAmericus ScheurichMRN: [id number] of Birth: 1December 25, 1972

## 2016-12-07 ENCOUNTER — Ambulatory Visit (INDEPENDENT_AMBULATORY_CARE_PROVIDER_SITE_OTHER): Payer: 59 | Admitting: Physical Therapy

## 2016-12-07 DIAGNOSIS — R531 Weakness: Secondary | ICD-10-CM | POA: Diagnosis not present

## 2016-12-07 DIAGNOSIS — R2689 Other abnormalities of gait and mobility: Secondary | ICD-10-CM | POA: Diagnosis not present

## 2016-12-07 DIAGNOSIS — M25561 Pain in right knee: Secondary | ICD-10-CM

## 2016-12-07 NOTE — Therapy (Signed)
[place], [place], [address] Phone: [phone]   Fax:  [phone]  Physical Therapy Treatment  Patient Details  Name: Valerie Lambert MRN: [id number] Date of Birth: 1971-06-30 Referring Provider: Dr. Hart Robinsons   Encounter Date: 12/07/2016      PT End of Session - 12/07/16 1020    Visit Number 18   Number of Visits 24   Date for PT Re-Evaluation 12/18/16   PT Start Time 1018   PT Stop Time 1116   PT Time Calculation (min) 58 min   Activity Tolerance Patient tolerated treatment well;No increased pain   Behavior During Therapy [facility] for tasks assessed/performed      No past medical history on file.  No past surgical history on file.  There were no vitals filed for this visit.      Subjective Assessment - 12/07/16 1023    Subjective Pt reports no new changes since last visit.    Currently in Pain? No/denies   Pain Score 0-No pain            [facility] PT Assessment - 12/07/16 0001      Assessment   Medical Diagnosis Rt ACL repair    Referring Provider Dr. Hart Robinsons    Onset Date/Surgical Date 09/21/16   Hand Dominance Right   Next MD Visit 12/11/16     Precautions   Precaution Comments ACL precautions      AROM   Right Knee Extension 0   Right Knee Flexion 123     Strength   Strength Assessment Site Hip;Knee   Right/Left Hip Right   Right Hip Flexion 5/5   Right Hip Extension --  5-/5   Right Hip ABduction 5/5   Right Hip ADduction 5/5   Right/Left Knee Right   Right Knee Flexion 4/5   Right Knee Extension 5/5     Flexibility   Quadriceps Rt knee flexion of 114 deg in prone,            [facility] Adult PT Treatment/Exercise - 12/07/16 0001      Knee/Hip Exercises: Stretches   Passive Hamstring Stretch Right;2 reps;60 seconds   Quad Stretch Right;3 reps;60 seconds   Gastroc Stretch Right;2 reps;30 seconds   Soleus Stretch Right;2 reps;30 seconds     Knee/Hip Exercises: Aerobic   Recumbent Bike L3: 6 min      Knee/Hip Exercises: Standing   Heel Raises Both;1 set   Heel Raises Limitations Rt single leg heel raises x 5 reps, 2 sets (fatigued quickly)    SLS Rt single leg squat to elevated table x 10 reps;  Rt Single leg forward leans x 10 reps;  Rt SLS on upside down Bosu x 30 sec x 2 trials.    Other Standing Knee Exercises split squats ( limited depth) x 10 reps each leg.      Knee/Hip Exercises: Seated   Other Seated Knee/Hip Exercises seated scoots x 10 sec, x 5 reps      Knee/Hip Exercises: Supine   Bridges Limitations bridge with legs on green ball x 5, then reverse hamstring curls x 5, bridge with hamstring curls x 5 reps    Single Leg Bridge Right;1 set;10 reps     Vasopneumatic   Number Minutes Vasopneumatic  15 minutes   Vasopnuematic Location  Knee  Rt   Vasopneumatic Pressure Low   Vasopneumatic Temperature  32 deg  PT Short Term Goals - 12/04/16 1128      PT SHORT TERM GOAL #1   Title Increase AROM Rt knee 0-115 deg 11/06/16   Time 6   Period Weeks   Status Achieved     PT SHORT TERM GOAL #2   Title Independent gait with least assistive device and good gait pattern 11/06/16   Time 6   Period Weeks   Status Achieved     PT SHORT TERM GOAL #3   Title Independent in initial HEP 11/06/16   Time 6   Period Weeks   Status Achieved           PT Long Term Goals - 12/07/16 1057      PT LONG TERM GOAL #1   Title Full ROM Rt knee 12/18/16    Time 12   Period Weeks   Status Partially Met     PT LONG TERM GOAL #2   Title 5/5 strength Rt LE 12/18/16   Time 12   Period Weeks   Status Partially Met     PT LONG TERM GOAL #3   Title Normal gait pattern without assistive device 12/18/16   Time 12   Period Weeks   Status Achieved     PT LONG TERM GOAL #4   Title Independent in HEP 12/18/16   Time 12   Period Weeks   Status On-going     PT LONG TERM GOAL #5   Title Improve  FOTO to </= 41% limitation 12/18/16   Time 12   Period Weeks   Status Achieved               Plan - 12/07/16 1303    Clinical Impression Statement Valerie Lambert's Rt knee ROM continues to improve each visit; now 0-123 deg (LLE is 0-127 deg).   Her Rt LE strength has improved as well. She tested weak in her Rt hamstring and shows quad weakness in functional exercises/activities.  She has progressed well and is near meeting remaining goals.    Rehab Potential Good   PT Frequency 2x / week   PT Duration 12 weeks   PT Treatment/Interventions Patient/family education;ADLs/Self Care Home Management;Cryotherapy;Electrical Stimulation;Iontophoresis 21m/ml Dexamethasone;Moist Heat;Ultrasound;Dry needling;Manual techniques;Vasopneumatic Device;Therapeutic activities;Therapeutic exercise   PT Next Visit Plan Continue progressive Rt knee ROM/ strength per ACL protocol. progress HEP.    Consulted and Agree with Plan of Care Patient      Patient will benefit from skilled therapeutic intervention in order to improve the following deficits and impairments:  Postural dysfunction, Improper body mechanics, Pain, Decreased range of motion, Decreased mobility, Decreased strength, Abnormal gait, Increased fascial restricitons, Increased muscle spasms, Decreased activity tolerance  Visit Diagnosis: Weakness generalized  Acute pain of right knee  Other abnormalities of gait and mobility     Problem List There are no active problems to display for this patient.  Valerie Lambert PTA 12/07/16 1:07 PM  Valerie Lambert Name: Valerie BrickmanMRN: [id number] of Birth: 108/22/1972

## 2016-12-11 ENCOUNTER — Ambulatory Visit (INDEPENDENT_AMBULATORY_CARE_PROVIDER_SITE_OTHER): Payer: 59 | Admitting: Physical Therapy

## 2016-12-11 DIAGNOSIS — R531 Weakness: Secondary | ICD-10-CM | POA: Diagnosis not present

## 2016-12-11 DIAGNOSIS — R2689 Other abnormalities of gait and mobility: Secondary | ICD-10-CM

## 2016-12-11 DIAGNOSIS — M25561 Pain in right knee: Secondary | ICD-10-CM | POA: Diagnosis not present

## 2016-12-11 NOTE — Therapy (Signed)
[place], [place], [address] Phone: [phone]   Fax:  [phone]  Physical Therapy Treatment  Patient Details  Name: Valerie Lambert MRN: [id number] Date of Birth: [birth year] Referring Provider: Dr. Hart Robinsons  Encounter Date: 12/11/2016      PT End of Session - 12/11/16 1239    Visit Number 19   Number of Visits 24   Date for PT Re-Evaluation 12/18/16   PT Start Time 1146   PT Stop Time 8299  ice last 12 min   PT Time Calculation (min) 57 min   Activity Tolerance Patient tolerated treatment well;No increased pain   Behavior During Therapy [facility] for tasks assessed/performed      No past medical history on file.  No past surgical history on file.  There were no vitals filed for this visit.      Subjective Assessment - 12/11/16 1149    Subjective Pt reports she would like to practice/review getting up and down off of ground safely. Otherwise, nothing new to report.    Currently in Pain? No/denies   Pain Score 0-No pain   Pain Location Knee   Pain Orientation Right   Pain Descriptors / Indicators Sore            [facility] PT Assessment - 12/11/16 0001      Assessment   Medical Diagnosis Rt ACL repair    Referring Provider Dr. Hart Robinsons   Onset Date/Surgical Date 09/21/16   Hand Dominance Right   Next MD Visit 12/11/16     AROM   Right Knee Extension 0   Right Knee Flexion 123   Left Knee Flexion 127          [facility] Adult PT Treatment/Exercise - 12/11/16 0001      Knee/Hip Exercises: Stretches   Passive Hamstring Stretch Right;3 reps;30 seconds   Gastroc Stretch Right;Left;3 reps;30 seconds     Knee/Hip Exercises: Aerobic   Elliptical L1.5: 3 min     Knee/Hip Exercises: Standing   Forward Step Up --  13" step x 10 reps, 1 hand support.    SLS Rt SLS on bosu ball x 30 sec with occasional UE support.    Other Standing Knee Exercises QUADRUPED: rocking  back towards heels x 4 reps (to simulate looking under bed)   Other Standing Knee Exercises split squats x 10 reps each leg.  High kneeling on 3" pad to to standing x 5 reps;       Knee/Hip Exercises: Supine   Heel Slides --  1 rep for measurement   Bridges Limitations bridge with legs on green ball x 8, then reverse hamstring curls x 5, bridge with hamstring curls x 5 reps    Single Leg Bridge Right;1 set;10 reps   Straight Leg Raise with External Rotation Strengthening;Right;1 set;15 reps  (long sitting)   Straight Leg Raise with External Rotation Limitations with hip abdct/add     Cryotherapy   Number Minutes Cryotherapy 12 Minutes   Cryotherapy Location Knee  Rt   Type of Cryotherapy Ice pack                  PT Short Term Goals - 12/04/16 1128      PT SHORT TERM GOAL #1   Title Increase AROM Rt knee 0-115 deg 11/06/16   Time 6   Period Weeks   Status Achieved     PT SHORT TERM GOAL #2  Title Independent gait with least assistive device and good gait pattern 11/06/16   Time 6   Period Weeks   Status Achieved     PT SHORT TERM GOAL #3   Title Independent in initial HEP 11/06/16   Time 6   Period Weeks   Status Achieved           PT Long Term Goals - 12/11/16 1240      PT LONG TERM GOAL #1   Title Full ROM Rt knee 12/18/16    Time 12   Period Weeks   Status Partially Met     PT LONG TERM GOAL #2   Title 5/5 strength Rt LE 12/18/16   Time 12   Period Weeks   Status Partially Met     PT LONG TERM GOAL #3   Title Normal gait pattern without assistive device 12/18/16   Time 12   Period Weeks   Status Achieved     PT LONG TERM GOAL #4   Title Independent in HEP 12/18/16   Time 12   Period Weeks   Status On-going     PT LONG TERM GOAL #5   Title Improve FOTO to </= 41% limitation 12/18/16   Time 12   Period Weeks   Status Achieved               Plan - 12/11/16 1257    Clinical Impression Statement Jerene Pitch is now 11 wks s/p ACL  reconstruction.  She had decreased tolerance in Rt knee for high kneeling position on blue pad. Rt knee ROM is unchanged from last visit.  She tolerated all exercises without increase in pain.  She is near meeting remaining goals.    Rehab Potential Good   PT Frequency 2x / week   PT Duration 12 weeks   PT Treatment/Interventions Patient/family education;ADLs/Self Care Home Management;Cryotherapy;Electrical Stimulation;Iontophoresis 69m/ml Dexamethasone;Moist Heat;Ultrasound;Dry needling;Manual techniques;Vasopneumatic Device;Therapeutic activities;Therapeutic exercise   PT Next Visit Plan Trial of walk/jog. Assess readiness to d/c .   Consulted and Agree with Plan of Care Patient      Patient will benefit from skilled therapeutic intervention in order to improve the following deficits and impairments:  Postural dysfunction, Improper body mechanics, Pain, Decreased range of motion, Decreased mobility, Decreased strength, Abnormal gait, Increased fascial restricitons, Increased muscle spasms, Decreased activity tolerance  Visit Diagnosis: Weakness generalized  Acute pain of right knee  Other abnormalities of gait and mobility     Problem List There are no active problems to display for this patient.  JKerin Perna PTA 12/11/16 1:01 PM  [facility] [place] [address]Phone: [phone]  Fax:  [phone] Name: AQuinnie BarceloMRN: [id number] of Birth: 103-25-1972

## 2016-12-14 ENCOUNTER — Encounter: Payer: 59 | Admitting: Physical Therapy

## 2016-12-20 ENCOUNTER — Encounter: Payer: 59 | Admitting: Physical Therapy

## 2016-12-21 ENCOUNTER — Encounter: Payer: 59 | Admitting: Physical Therapy

## 2016-12-27 ENCOUNTER — Ambulatory Visit (INDEPENDENT_AMBULATORY_CARE_PROVIDER_SITE_OTHER): Payer: 59 | Admitting: Physical Therapy

## 2016-12-27 DIAGNOSIS — R2689 Other abnormalities of gait and mobility: Secondary | ICD-10-CM | POA: Diagnosis not present

## 2016-12-27 DIAGNOSIS — R531 Weakness: Secondary | ICD-10-CM

## 2016-12-27 DIAGNOSIS — M25561 Pain in right knee: Secondary | ICD-10-CM

## 2016-12-27 NOTE — Therapy (Addendum)
[facility] [place] [facility] [place], [place], [address] Phone: [phone]   Fax:  [phone]  Physical Therapy Treatment  Patient Details  Name: Valerie Lambert MRN: [id number] Date of Birth: 12-13-1970 Referring Provider: Dr. Hart Robinsons  Encounter Date: 12/27/2016      PT End of Session - 12/27/16 1140    Visit Number 20   Number of Visits 24   Date for PT Re-Evaluation 12/18/16   PT Start Time 1055   PT Stop Time 1139   PT Time Calculation (min) 44 min   Activity Tolerance Patient tolerated treatment well;No increased pain      No past medical history on file.  No past surgical history on file.  There were no vitals filed for this visit.      Subjective Assessment - 12/27/16 1240    Subjective Pt reports she had a good visit with MD; he was pleased with progress.  She'd like to try trial of jogging, as well as know how to get up and off floor safely. She was able to climb bleachers without difficulty.     Patient Stated Goals get knee working again so she can return to normal activities    Currently in Pain? No/denies   Pain Score 0-No pain            [facility] PT Assessment - 12/27/16 0001      Assessment   Medical Diagnosis Rt ACL repair    Referring Provider Dr. Hart Robinsons   Onset Date/Surgical Date 09/21/16   Hand Dominance Right     AROM   Right Knee Extension 0   Right Knee Flexion 127   Left Knee Flexion 127     Strength   Right/Left Hip Right   Right Hip Flexion 5/5   Right Hip Extension --  5-/5   Right Hip ABduction 5/5   Right Hip ADduction 5/5   Right Knee Flexion --  5-/5   Right Knee Extension 5/5     Flexibility   Quadriceps Rt knee flexion, 120 deg          [facility] Adult PT Treatment/Exercise - 12/27/16 0001      Exercises   Exercises Other Exercises   Other Exercises  Plank (high) x 15 sec x 2 reps.      Knee/Hip Exercises: Stretches   Sports administrator Right;3  reps;60 seconds   Gastroc Stretch Right;Left;3 reps;30 seconds     Knee/Hip Exercises: Aerobic   Tread Mill 3 min @ 2.0-2.5, then jog @ 3.2 x 30 sec, walk 1 min, jog @ 3.5 for 30 sec.     Recumbent Bike L2-4: 5 min      Knee/Hip Exercises: Standing   Functional Squat 2 sets;10 reps  2nd set holding 5 lb wt.    Other Standing Knee Exercises split squats x 10 reps each leg.  High kneeling on pillow to sit, to standing x 5 reps       Knee/Hip Exercises: Prone   Hamstring Curl 1 set;10 reps;5 seconds  isometric holds, at various angles.      Modalities   Modalities --  pt declined     Verbally reviewed couch to 5k parameters and existing HEP.       PT Short Term Goals - 12/04/16 1128      PT SHORT TERM GOAL #1   Title Increase AROM Rt knee 0-115 deg 11/06/16   Time 6   Period Weeks  Status Achieved     PT SHORT TERM GOAL #2   Title Independent gait with least assistive device and good gait pattern 11/06/16   Time 6   Period Weeks   Status Achieved     PT SHORT TERM GOAL #3   Title Independent in initial HEP 11/06/16   Time 6   Period Weeks   Status Achieved           PT Long Term Goals - 12/27/16 1208      PT LONG TERM GOAL #1   Title Full ROM Rt knee 12/18/16    Time 12   Period Weeks   Status Achieved     PT LONG TERM GOAL #2   Title 5/5 strength Rt LE 12/18/16   Time 12   Period Weeks   Status Partially Met     PT LONG TERM GOAL #3   Title Normal gait pattern without assistive device 12/18/16   Time 12   Period Weeks   Status Achieved     PT LONG TERM GOAL #4   Title Independent in HEP 12/18/16   Time 12   Period Weeks   Status Achieved     PT LONG TERM GOAL #5   Title Improve FOTO to </= 41% limitation 12/18/16   Time 12   Period Weeks   Status Unable to assess  no FOTO score was captured.                Plan - 12/27/16 1210    Clinical Impression Statement Jerene Pitch demonstrated improved Rt knee ROM and strength.  Her goals of  getting up and off floor without assistance and a trial jog were met today.  She tolerated all exercises well without any symptoms.  Pt has partially met her goals but verbalized readiness to d/c to HEP at this time.    PT Frequency 2x / week   PT Duration 12 weeks   PT Treatment/Interventions Patient/family education;ADLs/Self Care Home Management;Cryotherapy;Electrical Stimulation;Iontophoresis 18m/ml Dexamethasone;Moist Heat;Ultrasound;Dry needling;Manual techniques;Vasopneumatic Device;Therapeutic activities;Therapeutic exercise      Patient will benefit from skilled therapeutic intervention in order to improve the following deficits and impairments:  Postural dysfunction, Improper body mechanics, Pain, Decreased range of motion, Decreased mobility, Decreased strength, Abnormal gait, Increased fascial restricitons, Increased muscle spasms, Decreased activity tolerance  Visit Diagnosis: Weakness generalized  Acute pain of right knee  Other abnormalities of gait and mobility     Problem List There are no active problems to display for this patient.  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  Patient goals were partially met. Patient is being discharged due to being pleased with the current functional level.  ?????    Celyn P. HHelene KelpPT, MPH 01/02/17 12:26 PM

## 2017-01-29 ENCOUNTER — Other Ambulatory Visit: Payer: Self-pay | Admitting: Obstetrics and Gynecology

## 2017-01-29 DIAGNOSIS — Z1231 Encounter for screening mammogram for malignant neoplasm of breast: Secondary | ICD-10-CM

## 2017-02-09 ENCOUNTER — Ambulatory Visit: Payer: 59

## 2017-02-15 ENCOUNTER — Ambulatory Visit: Payer: 59

## 2017-06-05 ENCOUNTER — Encounter: Payer: Self-pay | Admitting: Radiology

## 2017-06-05 ENCOUNTER — Ambulatory Visit
Admission: RE | Admit: 2017-06-05 | Discharge: 2017-06-05 | Disposition: A | Discharge status: Home / Self Care | Payer: 59 | Source: Ambulatory Visit | Attending: Obstetrics and Gynecology | Admitting: Obstetrics and Gynecology

## 2017-06-05 DIAGNOSIS — Z1231 Encounter for screening mammogram for malignant neoplasm of breast: Secondary | ICD-10-CM

## 2018-05-28 ENCOUNTER — Other Ambulatory Visit: Payer: Self-pay | Admitting: Obstetrics and Gynecology

## 2018-05-28 DIAGNOSIS — Z1231 Encounter for screening mammogram for malignant neoplasm of breast: Secondary | ICD-10-CM

## 2018-07-11 ENCOUNTER — Ambulatory Visit
Admission: RE | Admit: 2018-07-11 | Discharge: 2018-07-11 | Disposition: A | Discharge status: Home / Self Care | Payer: 59 | Source: Ambulatory Visit | Attending: Obstetrics and Gynecology | Admitting: Obstetrics and Gynecology

## 2018-07-11 DIAGNOSIS — Z1231 Encounter for screening mammogram for malignant neoplasm of breast: Secondary | ICD-10-CM

## 2019-06-10 ENCOUNTER — Other Ambulatory Visit: Payer: Self-pay | Admitting: Obstetrics and Gynecology

## 2019-06-10 ENCOUNTER — Other Ambulatory Visit: Payer: Self-pay | Admitting: Optometry

## 2019-06-10 DIAGNOSIS — Z1231 Encounter for screening mammogram for malignant neoplasm of breast: Secondary | ICD-10-CM

## 2019-08-06 ENCOUNTER — Ambulatory Visit
Admission: RE | Admit: 2019-08-06 | Discharge: 2019-08-06 | Disposition: A | Discharge status: Home / Self Care | Payer: 59 | Source: Ambulatory Visit | Attending: Obstetrics and Gynecology | Admitting: Obstetrics and Gynecology

## 2019-08-06 ENCOUNTER — Other Ambulatory Visit: Payer: Self-pay

## 2019-08-06 DIAGNOSIS — Z1231 Encounter for screening mammogram for malignant neoplasm of breast: Secondary | ICD-10-CM

## 2020-07-28 ENCOUNTER — Other Ambulatory Visit: Payer: Self-pay | Admitting: Obstetrics and Gynecology

## 2020-07-28 DIAGNOSIS — Z1231 Encounter for screening mammogram for malignant neoplasm of breast: Secondary | ICD-10-CM

## 2020-09-06 ENCOUNTER — Other Ambulatory Visit: Payer: Self-pay

## 2020-09-06 ENCOUNTER — Ambulatory Visit
Admission: RE | Admit: 2020-09-06 | Discharge: 2020-09-06 | Disposition: A | Discharge status: Home / Self Care | Payer: 59 | Source: Ambulatory Visit | Attending: Obstetrics and Gynecology | Admitting: Obstetrics and Gynecology

## 2020-09-06 DIAGNOSIS — Z1231 Encounter for screening mammogram for malignant neoplasm of breast: Secondary | ICD-10-CM

## 2021-08-22 ENCOUNTER — Other Ambulatory Visit: Payer: Self-pay | Admitting: Obstetrics and Gynecology

## 2021-08-22 DIAGNOSIS — Z1231 Encounter for screening mammogram for malignant neoplasm of breast: Secondary | ICD-10-CM

## 2021-09-07 ENCOUNTER — Ambulatory Visit
Admission: RE | Admit: 2021-09-07 | Discharge: 2021-09-07 | Disposition: A | Discharge status: Home / Self Care | Payer: 59 | Source: Ambulatory Visit | Attending: Obstetrics and Gynecology | Admitting: Obstetrics and Gynecology

## 2021-09-07 DIAGNOSIS — Z1231 Encounter for screening mammogram for malignant neoplasm of breast: Secondary | ICD-10-CM

## 2022-09-01 ENCOUNTER — Other Ambulatory Visit: Payer: Self-pay | Admitting: Obstetrics and Gynecology

## 2022-09-01 DIAGNOSIS — Z1231 Encounter for screening mammogram for malignant neoplasm of breast: Secondary | ICD-10-CM

## 2022-10-16 ENCOUNTER — Ambulatory Visit: Payer: 59

## 2022-10-24 ENCOUNTER — Ambulatory Visit
Admission: RE | Admit: 2022-10-24 | Discharge: 2022-10-24 | Disposition: A | Discharge status: Home / Self Care | Payer: 59 | Source: Ambulatory Visit | Attending: Obstetrics and Gynecology | Admitting: Obstetrics and Gynecology

## 2022-10-24 DIAGNOSIS — Z1231 Encounter for screening mammogram for malignant neoplasm of breast: Secondary | ICD-10-CM

## 2023-06-04 IMAGING — MG DIGITAL SCREENING BREAST BILAT IMPLANT W/ TOMO W/ CAD
8 of 12 series · 8 of 28 positions shown · non-contrast
Comparison: Previous exam(s).

CLINICAL DATA: Screening.

EXAM:
DIGITAL SCREENING BILATERAL MAMMOGRAM WITH IMPLANTS, CAD AND
TOMOSYNTHESIS
TECHNIQUE: Bilateral screening digital craniocaudal and mediolateral oblique
mammograms were obtained. Bilateral screening digital breast
tomosynthesis was performed. The images were evaluated with
computer-aided detection. Standard and/or implant displaced views
were performed.

[L CC]
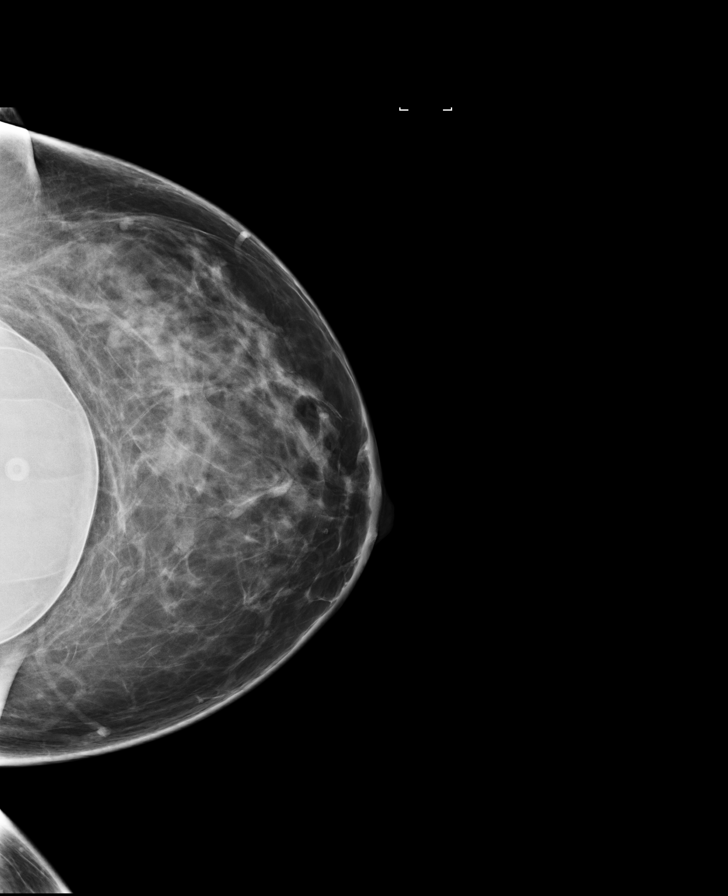

[R CC]
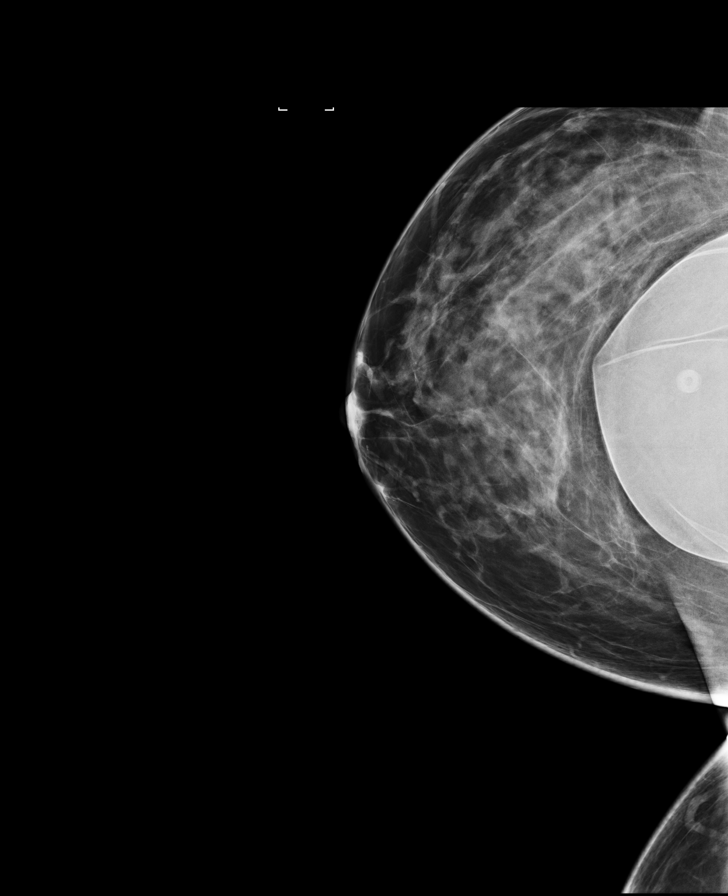

[L MLO]
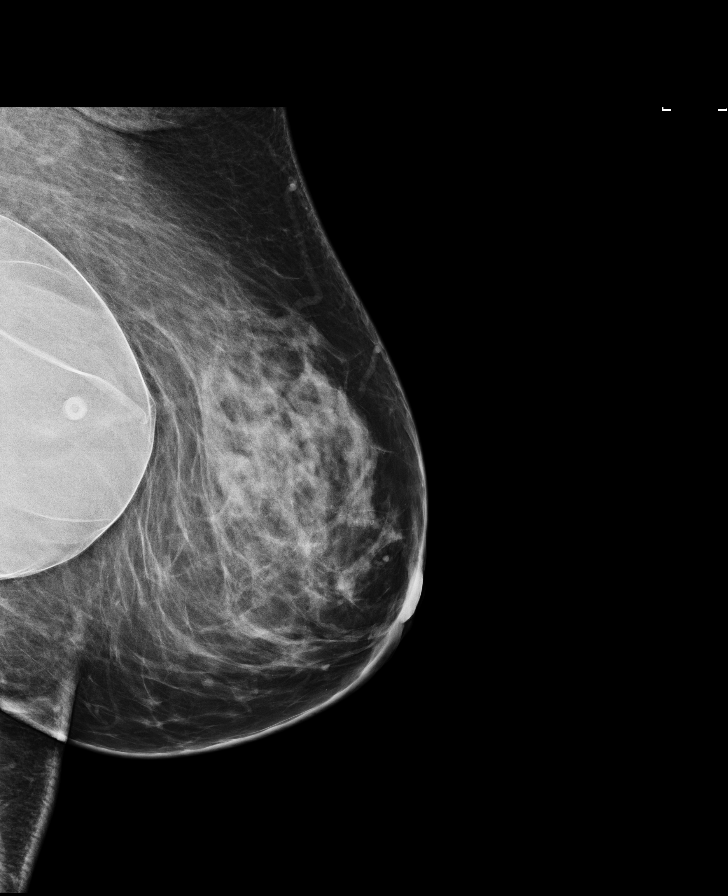

[R MLO]
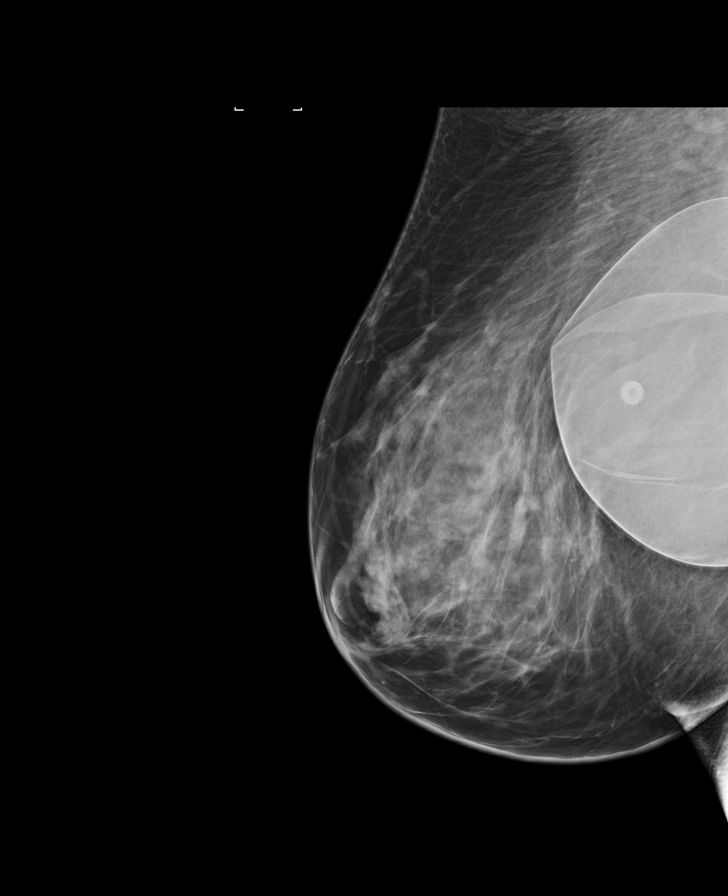

[R CC synth-2D]
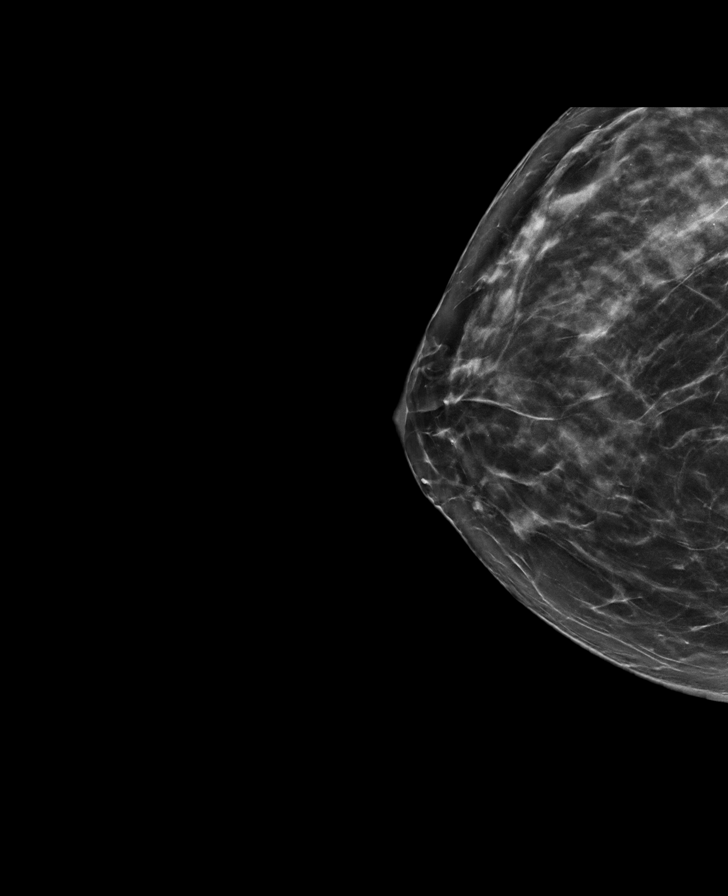

[R MLO synth-2D]
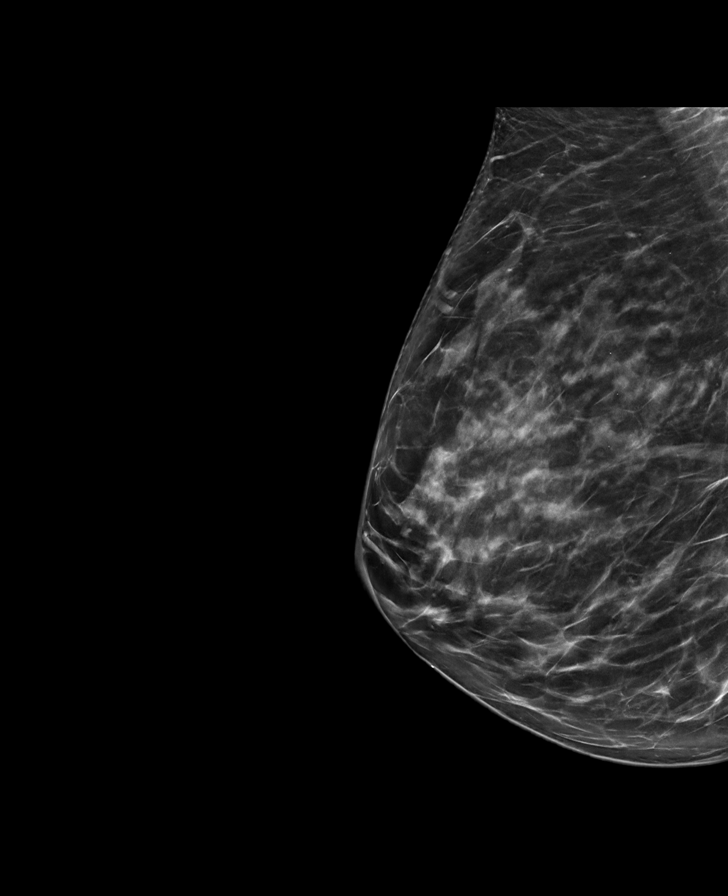

[L CC synth-2D]
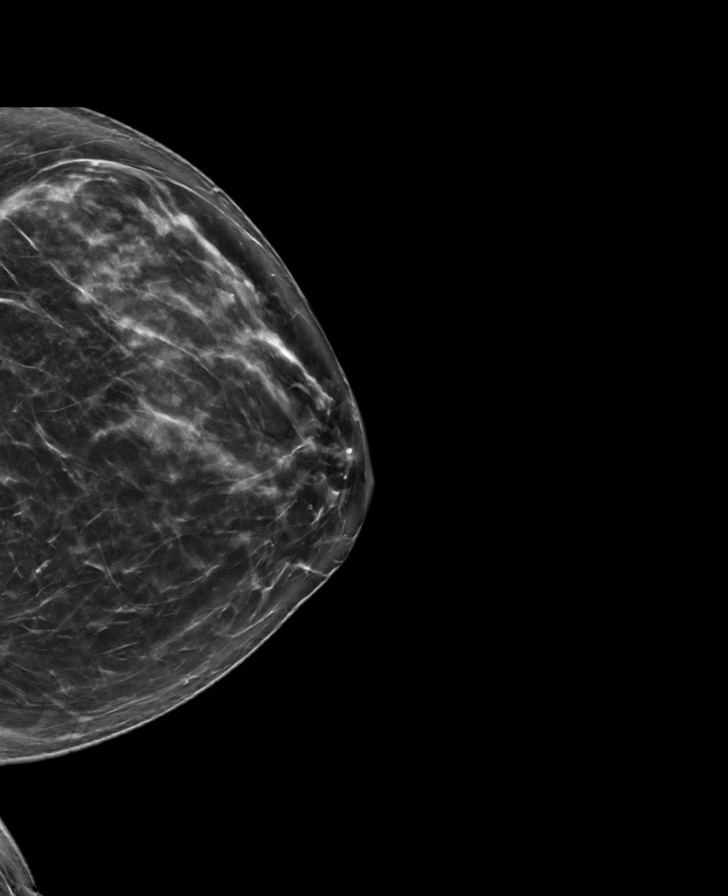

[L MLO synth-2D]
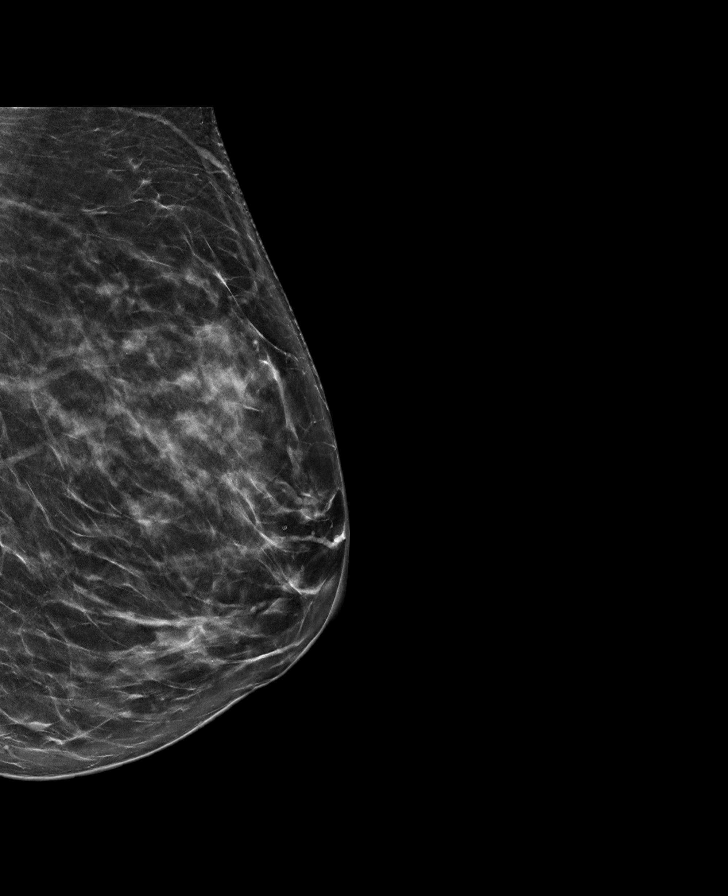

[8 of 28 positions shown; findings below may reference images not displayed]

ACR Breast Density Category c: The breast tissue is heterogeneously
dense, which may obscure small masses.
FINDINGS: The patient has retropectoral saline implants. There are no findings
suspicious for malignancy.
IMPRESSION: No mammographic evidence of malignancy. A result letter of this
screening mammogram will be mailed directly to the patient.

RECOMMENDATION:
Screening mammogram in one year. (Code:FQ-5-EOL)

BI-RADS CATEGORY  1:  Negative.

## 2023-10-18 ENCOUNTER — Other Ambulatory Visit: Payer: Self-pay | Admitting: Obstetrics and Gynecology

## 2023-10-18 DIAGNOSIS — Z1231 Encounter for screening mammogram for malignant neoplasm of breast: Secondary | ICD-10-CM

## 2023-10-25 ENCOUNTER — Ambulatory Visit

## 2023-11-06 ENCOUNTER — Ambulatory Visit
Admission: RE | Admit: 2023-11-06 | Discharge: 2023-11-06 | Disposition: A | Discharge status: Home / Self Care | Source: Ambulatory Visit | Attending: Obstetrics and Gynecology | Admitting: Obstetrics and Gynecology

## 2023-11-06 DIAGNOSIS — Z1231 Encounter for screening mammogram for malignant neoplasm of breast: Secondary | ICD-10-CM
